# Patient Record
Sex: Female | Born: 2000 | Race: White | Hispanic: No | Marital: Single | Smoking: Never smoker
Health system: Southern US, Academic
[De-identification: ages and names within clinical notes are randomized; demographics above are authoritative.]

## PROBLEM LIST (undated history)

## (undated) ENCOUNTER — Encounter (HOSPITAL_COMMUNITY): Admission: RE | Payer: Self-pay | Source: Ambulatory Visit

## (undated) ENCOUNTER — Inpatient Hospital Stay (HOSPITAL_COMMUNITY): Admission: RE | Payer: MEDICAID | Source: Ambulatory Visit | Admitting: INTERNAL MEDICINE

## (undated) DIAGNOSIS — N182 Chronic kidney disease, stage 2 (mild): Secondary | ICD-10-CM

## (undated) DIAGNOSIS — F32A Depression, unspecified: Secondary | ICD-10-CM

## (undated) DIAGNOSIS — F329 Major depressive disorder, single episode, unspecified: Secondary | ICD-10-CM

## (undated) DIAGNOSIS — G43709 Chronic migraine without aura, not intractable, without status migrainosus: Secondary | ICD-10-CM

## (undated) DIAGNOSIS — J020 Streptococcal pharyngitis: Secondary | ICD-10-CM

## (undated) DIAGNOSIS — F319 Bipolar disorder, unspecified: Secondary | ICD-10-CM

## (undated) DIAGNOSIS — J45909 Unspecified asthma, uncomplicated: Secondary | ICD-10-CM

## (undated) DIAGNOSIS — F419 Anxiety disorder, unspecified: Secondary | ICD-10-CM

## (undated) DIAGNOSIS — F909 Attention-deficit hyperactivity disorder, unspecified type: Secondary | ICD-10-CM

## (undated) DIAGNOSIS — IMO0002 Reserved for concepts with insufficient information to code with codable children: Secondary | ICD-10-CM

## (undated) DIAGNOSIS — N390 Urinary tract infection, site not specified: Secondary | ICD-10-CM

## (undated) DIAGNOSIS — K5900 Constipation, unspecified: Secondary | ICD-10-CM

## (undated) DIAGNOSIS — N200 Calculus of kidney: Secondary | ICD-10-CM

## (undated) HISTORY — DX: Reserved for concepts with insufficient information to code with codable children: IMO0002

## (undated) HISTORY — DX: Anxiety disorder, unspecified: F41.9

## (undated) HISTORY — DX: Depression, unspecified: F32.A

## (undated) HISTORY — DX: Bipolar disorder, unspecified: F31.9

## (undated) SURGERY — GASTROSCOPY
Anesthesia: Monitor Anesthesia Care

---
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
## 2019-01-08 ENCOUNTER — Telehealth: Payer: Self-pay

## 2019-01-08 NOTE — Telephone Encounter (Signed)
Katelyn Gain, MD  [facility]        She will be set to do next nucala home dose in a month (01/30/19).  Can someone make note to call her in like 3 weeks to see if she received shipment of her home dose?       please call pt around nov 13th to see how she is doing.

## 2019-02-08 DIAGNOSIS — Z9189 Other specified personal risk factors, not elsewhere classified: Secondary | ICD-10-CM | POA: Insufficient documentation

## 2019-02-08 DIAGNOSIS — J069 Acute upper respiratory infection, unspecified: Secondary | ICD-10-CM | POA: Insufficient documentation

## 2019-02-10 DIAGNOSIS — R059 Cough, unspecified: Secondary | ICD-10-CM | POA: Insufficient documentation

## 2019-02-10 DIAGNOSIS — Z209 Contact with and (suspected) exposure to unspecified communicable disease: Secondary | ICD-10-CM | POA: Insufficient documentation

## 2019-02-24 ENCOUNTER — Ambulatory Visit: Payer: Medicaid Other | Admitting: Allergy

## 2019-03-26 ENCOUNTER — Encounter: Payer: Self-pay | Admitting: Neurology

## 2019-03-27 ENCOUNTER — Other Ambulatory Visit: Payer: Self-pay

## 2019-03-27 ENCOUNTER — Telehealth (INDEPENDENT_AMBULATORY_CARE_PROVIDER_SITE_OTHER): Payer: Medicaid Other | Admitting: Neurology

## 2019-03-27 VITALS — Ht 67.0 in | Wt 135.0 lb

## 2019-03-27 DIAGNOSIS — F984 Stereotyped movement disorders: Secondary | ICD-10-CM

## 2019-03-27 DIAGNOSIS — G43009 Migraine without aura, not intractable, without status migrainosus: Secondary | ICD-10-CM

## 2019-03-27 NOTE — Progress Notes (Signed)
   Virtual Visit via Video Note The purpose of this virtual visit is to provide medical care while limiting exposure to the novel coronavirus.    Consent was obtained for video visit:  Yes.   Answered questions that patient had about telehealth interaction:  Yes.   I discussed the limitations, risks, security and privacy concerns of performing an evaluation and management service by telemedicine. I also discussed with the patient that there may be a patient responsible charge related to this service. The patient expressed understanding and agreed to proceed.  Pt location: Home Physician Location: office Name of referring provider:  Suann Larry, PA-C I connected with [name] at patients initiation/request on 03/27/2019 at  8:30 AM EST by video enabled telemedicine application and verified that I am speaking with the correct person using two identifiers. Pt MRN:  [id number] Pt DOB:  [birth year] Video Participants:  [name]   History of Present Illness: This is a 19 y.o. female returning for follow-up of migraine headaches and new complaints of abnormal movements.  She is doing well on nortripytline 20mg  and gets headaches about 2-3 times per month, which is responsive to tylenol and imitrex. She reports seeing her psychiatrist, in [place], who asked her to see me for concern whether she has tic disorder because of episodic neck movement and repetitive hand movements.  Patient reports having these movements since childhood and it worse with stress or anxiety.  No abnormal speech pattern, forced eye closure or facial twitches.     Observations/Objective:   Vitals:   03/26/19 1100  Weight: 135 lb (61.2 kg)  Height: 5\' 7"  (1.702 m)   Patient is awake, alert, and appears comfortable.  Oriented x 4.   Extraocular muscles are intact. No ptosis.  Face is symmetric.  Speech is not dysarthric. Tongue is midline. No abnormal tongue, facial or neck movements are observed.  Antigravity in all extremities.  No pronator drift.  Finger tapping is normal.  No abnormal movements are seen in the limbs Gait appears normal.  She is able to stand on heels and toes.   Assessment and Plan:  1.  Chronic migraine, headaches are stable and occur 2-3 times per month  - Continue nortriptyline to 30mg  at bedtime  - Continue home neck PT exercises  2.  Episodic migraine without aura  - Continue Imitrex 100mg  at headache onset which is effective  3.  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  Recommend that she follow-up with counselor for stress/anxiety management coping strategies as this is does not warrant medication  4.  Polypharmacy.    Follow Up Instructions:   I discussed the assessment and treatment plan with the patient. The patient was provided an opportunity to ask questions and all were answered. The patient agreed with the plan and demonstrated an understanding of the instructions.   The patient was advised to call back or seek an in-person evaluation if the symptoms worsen or if the condition fails to improve as anticipated.  Follow-up in 8 months  Total time spent on day of this encounter:  30 min  Donika 05/24/19, DO

## 2019-05-05 ENCOUNTER — Encounter: Payer: Self-pay | Admitting: Allergy

## 2019-05-05 ENCOUNTER — Other Ambulatory Visit: Payer: Self-pay

## 2019-05-05 ENCOUNTER — Ambulatory Visit (INDEPENDENT_AMBULATORY_CARE_PROVIDER_SITE_OTHER): Payer: Medicaid Other | Admitting: Allergy

## 2019-05-05 VITALS — BP 94/62 | HR 92 | Resp 18

## 2019-05-05 DIAGNOSIS — J3089 Other allergic rhinitis: Secondary | ICD-10-CM | POA: Diagnosis not present

## 2019-05-05 DIAGNOSIS — J4550 Severe persistent asthma, uncomplicated: Secondary | ICD-10-CM | POA: Diagnosis not present

## 2019-05-05 MED ORDER — MONTELUKAST SODIUM 10 MG PO TABS: ORAL_TABLET | ORAL | 5 refills | Status: DC

## 2019-05-05 NOTE — Progress Notes (Signed)
Follow-up Note  RE: Katelyn Best MRN: [id number] DOB: 2001/02/08 Date of Office Visit: 05/05/2019   History of present illness: Katelyn Best is a 19 y.o. female presenting today for follow-up of severe persistent asthma and allergic rhinitis.  She was last seen in the office on 12/30/2018 by myself.  She states she has been having more asthma symptoms including a cough, shortness of breath.  She states she has been needing to use her albuterol more consistently during the week and needing to use her albuterol nebulizer about 2 times a week since December.  She is doing her Symbicort 2 puffs twice a day as well as Singulair daily.  She does do her Nucala at home once a month.  She did have very good control of her symptoms with nucala previously but now she is having more symptoms.  She has not required any ED or urgent care visits or any systemic steroid needs. She states with her allergic rhinitis that she is doing relatively well without any significant symptoms right now.  She does have access to Xyzal for daily use as well as Astelin and Pataday for as needed use for nasal drainage and for itchy watery eyes respectively.  Review of systems: Review of Systems  Constitutional: Negative.   HENT: Negative.   Eyes: Negative.   Respiratory: Positive for cough, shortness of breath and wheezing.   Cardiovascular: Negative.   Gastrointestinal: Negative.   Musculoskeletal: Negative.   Skin: Negative.   Neurological: Negative.     All other systems negative unless noted above in HPI  Past medical/social/surgical/family history have been reviewed and are unchanged unless specifically indicated below.  No changes  Medication List: Current Outpatient Medications  Medication Sig Dispense Refill  . acetaminophen (TYLENOL) 500 MG tablet Take 500-1,000 mg by mouth every 6 (six) hours as needed for headache (pain/ menstrual cramps).    Marland Kitchen albuterol (PROVENTIL) (2.5 MG/3ML) 0.083% nebulizer  solution Take 3 mLs (2.5 mg total) by nebulization every 4 (four) hours as needed for wheezing or shortness of breath. 75 mL 1  . EPINEPHrine 0.3 mg/0.3 mL IJ SOAJ injection Use as directed for life threatening allergic reactions 2 Device 3  . escitalopram (LEXAPRO) 20 MG tablet Take 20 mg by mouth at bedtime.    Marland Kitchen LATUDA 60 MG TABS Take 60 mg by mouth daily.     Marland Kitchen levocetirizine (XYZAL) 5 MG tablet Take one tablet by mouth once a day for runny nose or itching. (Patient taking differently: 2.5 mg. Take one tablet by mouth once a day for runny nose or itching.) 30 tablet 5  . methylphenidate 36 MG PO CR tablet Take 36 mg by mouth daily.    . montelukast (SINGULAIR) 10 MG tablet Take one tablet once daily 30 tablet 5  . Multiple Vitamin (MULTIVITAMIN) capsule Take by mouth.    . nortriptyline (PAMELOR) 10 MG capsule Take 2 capsules (20 mg total) by mouth daily. 180 capsule 3  . Nutritional Supplements (NUTRITIONAL SUPPLEMENT PO) Take by mouth. Nature's Bounty Anxiety and Stress Relief    . pantoprazole (PROTONIX) 40 MG tablet Take 40 mg by mouth daily.    Marland Kitchen PROAIR HFA 108 (90 Base) MCG/ACT inhaler INHALE TWO PUFFS INTO THE LUNGS EVERY 4 HOURS AS NEEDED FOR WHEEZING/SOB 18 g 0  . SUMAtriptan (IMITREX) 100 MG tablet Take at 1 tablet onset of severe migraine.  May repeat in 2 hours if headache persists or recurs.  Limit to 2 tablets in  24 hr period. 10 tablet 5  . SYMBICORT 80-4.5 MCG/ACT inhaler Inhale 2 puffs into the lungs 2 (two) times a day.      No current facility-administered medications for this visit.     Known medication allergies: Allergies  Allergen Reactions  . Other     Told by nephrology to avoid NSAIDs given CKD  . Advair Diskus [Fluticasone-Salmeterol] Rash  . Prednisone Rash  . Qvar [Beclomethasone] Rash     Physical examination: Blood pressure 94/62, pulse 92, resp. rate 18, SpO2 97 %.  General: Alert, interactive, in no acute distress. HEENT: PERRLA, TMs pearly gray,  turbinates non-edematous without discharge, post-pharynx non erythematous. Neck: Supple without lymphadenopathy. Lungs: Clear to auscultation without wheezing, rhonchi or rales. {no increased work of breathing. CV: Normal S1, S2 without murmurs. Abdomen: Nondistended, nontender. Skin: Warm and dry, without lesions or rashes. Extremities:  No clubbing, cyanosis or edema. Neuro:   Grossly intact.  Diagnositics/Labs:  Spirometry: FEV1: 3.61L 102%, FVC: 3.79L 94%, ratio consistent with Nonobstructive pattern  Assessment and plan:    Asthma, severe persistent   - increase in symptoms with increased albuterol needs   - will have you try Breztri 2 puffs twice a day.   This is a triple agent inhaler similar to Symbicort but with an anticholingeric medication added.  Anticholingeric agents help with decrease mucus production in the airway.    Hold your Symbicort while using Breztri sample.  Let us know if you notice improved control and less albuterol use while on Breztri.    When [place] sample runs out resume Symbicort use.    - at this time will change Nucala to Dupixent injections which are done every 2 weeks which can be done at home self-administered.  Will notify Tammy of this change to start approval process.  Change to Dupixent is in hopes of improved symptom control.      - Continue Singulair 10 mg daily-take at bedtime   - Use albuterol inhaler 2-4 puffs or nebulizer 1 vial every 4-6 hours as needed for cough, wheeze, chest tightness or shortness of breath.  May use 15-20 minutes prior to activity.  Monitor frequency of use.  Asthma control goals:   Full participation in all desired activities (may need albuterol before activity)  Albuterol use two time or less a week on average (not counting use with activity)  Cough interfering with sleep two time or less a month  Oral steroids no more than once a year  No hospitalizations  Allergic rhinitis    - continue dust mite avoidance     - Continue Xyzal 5mg  daily    - Continue nasal antihistamine, Astelin 2 sprays 1-2 times a day as needed for runny nose/drainage    - for itchy/watery/red eyes use Pataday 1 drop each eye as needed daily  Follow-up 3-4 months or sooner if needed  I appreciate the opportunity to take part in [place] care. Please do not hesitate to contact me with questions.  Sincerely,   [place], MD Allergy/Immunology [facility]

## 2019-05-05 NOTE — Patient Instructions (Addendum)
Asthma, severe persistent   - increase in symptoms with increased albuterol needs   - will have you try Breztri 2 puffs twice a day.   This is a triple agent inhaler similar to Symbicort but with an anticholingeric medication added.  Anticholingeric agents help with decrease mucus production in the airway.    Hold your Symbicort while using Breztri sample.  Let us know if you notice improved control and less albuterol use while on Breztri.    When [place] sample runs out resume Symbicort use.    - at this time will change Nucala to Dupixent injections which are done every 2 weeks which can be done at home self-administered.  Will notify Tammy of this change to start approval process.  Change to Dupixent is in hopes of improved symptom control.      - Continue Singulair 10 mg daily-take at bedtime   - Use albuterol inhaler 2-4 puffs or nebulizer 1 vial every 4-6 hours as needed for cough, wheeze, chest tightness or shortness of breath.  May use 15-20 minutes prior to activity.  Monitor frequency of use.  Asthma control goals:   Full participation in all desired activities (may need albuterol before activity)  Albuterol use two time or less a week on average (not counting use with activity)  Cough interfering with sleep two time or less a month  Oral steroids no more than once a year  No hospitalizations  Allergic rhinitis    - continue dust mite avoidance    - Continue Xyzal 5mg  daily    - Continue nasal antihistamine, Astelin 2 sprays 1-2 times a day as needed for runny nose/drainage    - for itchy/watery/red eyes use Pataday 1 drop each eye as needed daily  Follow-up 3-4 months or sooner if needed

## 2019-05-06 ENCOUNTER — Telehealth: Payer: Self-pay | Admitting: *Deleted

## 2019-05-06 NOTE — Telephone Encounter (Signed)
-----   Message from [facility] Larose Hires, MD sent at 05/05/2019  6:04 PM EST ----- Hi she seems to be having increase in asthma symptoms despite monthly Nucala.  Wanted to look into changing to Dupixent for her.

## 2019-05-06 NOTE — Telephone Encounter (Signed)
Obtained approval for Dupixent. L/M for patient to call me so I can advise submit for same to [facility]

## 2019-05-11 NOTE — Telephone Encounter (Signed)
L/m for patient again to contact me ?

## 2019-05-18 ENCOUNTER — Other Ambulatory Visit: Payer: Self-pay | Admitting: Allergy

## 2019-05-18 NOTE — Telephone Encounter (Signed)
Spoke to patient and advised submit for Dupixent and needs to answer calls from [employer] to get shipment. Instux to bring in once Rx arrives for start

## 2019-05-19 ENCOUNTER — Ambulatory Visit: Payer: Medicaid Other | Admitting: *Deleted

## 2019-05-19 ENCOUNTER — Other Ambulatory Visit: Payer: Self-pay

## 2019-05-19 DIAGNOSIS — J4550 Severe persistent asthma, uncomplicated: Secondary | ICD-10-CM

## 2019-05-19 NOTE — Telephone Encounter (Signed)
Please advise 

## 2019-05-19 NOTE — Progress Notes (Signed)
Katelyn Best started her Dupixent today. She received 600mg  for her loading dose and will continue to receive 300mg  every 2 weeks, self-administering at home. I demonstrated using the demo device and Shazia then administered the Dupixent injections herself. She had no questions and she has an Epipen. Consent signed.

## 2019-06-05 DIAGNOSIS — J029 Acute pharyngitis, unspecified: Secondary | ICD-10-CM | POA: Insufficient documentation

## 2019-06-05 DIAGNOSIS — H6980 Other specified disorders of Eustachian tube, unspecified ear: Secondary | ICD-10-CM | POA: Insufficient documentation

## 2019-06-08 DIAGNOSIS — H66009 Acute suppurative otitis media without spontaneous rupture of ear drum, unspecified ear: Secondary | ICD-10-CM | POA: Insufficient documentation

## 2019-06-26 ENCOUNTER — Ambulatory Visit: Payer: Medicaid Other | Admitting: Neurology

## 2019-08-04 ENCOUNTER — Ambulatory Visit (INDEPENDENT_AMBULATORY_CARE_PROVIDER_SITE_OTHER): Payer: Medicaid Other | Admitting: Allergy

## 2019-08-04 ENCOUNTER — Encounter: Payer: Self-pay | Admitting: Allergy

## 2019-08-04 ENCOUNTER — Other Ambulatory Visit: Payer: Self-pay

## 2019-08-04 VITALS — BP 108/62 | HR 97 | Resp 18

## 2019-08-04 DIAGNOSIS — W57XXXD Bitten or stung by nonvenomous insect and other nonvenomous arthropods, subsequent encounter: Secondary | ICD-10-CM | POA: Diagnosis not present

## 2019-08-04 DIAGNOSIS — S50861D Insect bite (nonvenomous) of right forearm, subsequent encounter: Secondary | ICD-10-CM

## 2019-08-04 DIAGNOSIS — J3089 Other allergic rhinitis: Secondary | ICD-10-CM | POA: Diagnosis not present

## 2019-08-04 DIAGNOSIS — J4550 Severe persistent asthma, uncomplicated: Secondary | ICD-10-CM | POA: Diagnosis not present

## 2019-08-04 MED ORDER — TRIAMCINOLONE ACETONIDE 0.1 % EX OINT: TOPICAL_OINTMENT | CUTANEOUS | 3 refills | Status: DC

## 2019-08-04 MED ORDER — SYMBICORT 160-4.5 MCG/ACT IN AERO: INHALATION_SPRAY | RESPIRATORY_TRACT | 5 refills | Status: DC

## 2019-08-04 NOTE — Patient Instructions (Signed)
Asthma, severe persistent   - doing much better on Dupixent injections every 2 weeks!    - have access to Symbicort 2 puffs twice a day to initiate if not meeting below goals.   also can use Symbicort 2 puffs as needed for relief of symptoms (similar to how you use albuterol)  - continue Dupixent injections every 2 weeks self-administered.    - Continue Singulair 10 mg daily-take at bedtime   - Use albuterol inhaler 2-4 puffs or nebulizer 1 vial every 4-6 hours as needed for cough, wheeze, chest tightness or shortness of breath.  May use 15-20 minutes prior to activity.  Monitor frequency of use.  Asthma control goals:   Full participation in all desired activities (may need albuterol before activity)  Albuterol use two time or less a week on average (not counting use with activity)  Cough interfering with sleep two time or less a month  Oral steroids no more than once a year  No hospitalizations  Allergic rhinitis    - continue dust mite avoidance    - Continue Xyzal 5mg  daily (may take additional dose if needed)   - Continue nasal antihistamine, Astelin 2 sprays 1-2 times a day as needed for runny nose/drainage    - for itchy/watery/red eyes use Pataday 1 drop each eye as needed daily  Insect bites  - several insect bites with one likely with secondary skin infection.  Complete keflex course  - on smaller, red lesions can use Triamcinolone ointment twice a day as needed   Follow-up 4 months or sooner if needed
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
## 2019-10-26 ENCOUNTER — Ambulatory Visit: Payer: Medicaid Other | Admitting: Neurology

## 2019-11-02 ENCOUNTER — Other Ambulatory Visit: Payer: Self-pay | Admitting: Allergy and Immunology

## 2019-12-08 ENCOUNTER — Encounter: Payer: Self-pay | Admitting: Allergy

## 2019-12-08 ENCOUNTER — Other Ambulatory Visit: Payer: Self-pay

## 2019-12-08 ENCOUNTER — Ambulatory Visit (INDEPENDENT_AMBULATORY_CARE_PROVIDER_SITE_OTHER): Payer: Medicaid Other | Admitting: Allergy

## 2019-12-08 VITALS — BP 108/68 | HR 110 | Resp 16

## 2019-12-08 DIAGNOSIS — J4550 Severe persistent asthma, uncomplicated: Secondary | ICD-10-CM

## 2019-12-08 DIAGNOSIS — J3089 Other allergic rhinitis: Secondary | ICD-10-CM

## 2019-12-08 MED ORDER — MONTELUKAST SODIUM 10 MG PO TABS: ORAL_TABLET | ORAL | 5 refills | Status: DC

## 2019-12-08 NOTE — Patient Instructions (Addendum)
Asthma, severe persistent   - under good control on Dupixent injections every 2 weeks!    - have access to Symbicort 2 puffs twice a day to initiate if not meeting below goals.   also can use Symbicort 2 puffs as needed for relief of symptoms (similar to how you use albuterol)  - continue Dupixent injections every 2 weeks self-administered.      - Continue Singulair 10 mg daily-take at bedtime   - Use albuterol inhaler 2-4 puffs or nebulizer 1 vial every 4-6 hours as needed for cough, wheeze, chest tightness or shortness of breath.  May use 15-20 minutes prior to activity.  Monitor frequency of use.  Asthma control goals:   Full participation in all desired activities (may need albuterol before activity)  Albuterol use two time or less a week on average (not counting use with activity)  Cough interfering with sleep two time or less a month  Oral steroids no more than once a year  No hospitalizations  Allergic rhinitis    - continue dust mite avoidance    - Continue Xyzal 5mg  daily (may take additional dose if needed)   - Continue nasal antihistamine, Astelin 2 sprays 1-2 times a day as needed for runny nose/drainage    - for itchy/watery/red eyes use Pataday 1 drop each eye as needed daily  Follow-up 6 months or sooner if needed

## 2019-12-08 NOTE — Progress Notes (Signed)
Follow-up Note  RE: Katelyn Best MRN: [id number] DOB: 2000-09-11 Date of Office Visit: 12/08/2019   History of present illness: Katelyn Best is a 19 y.o. female presenting today for follow-up of asthma, allergic rhinitis.   She was last seen in the office on 08/04/19 by myself.  Since her last visit she had a bunionectomy performed.  She states this was her second surgery as she had to have a revision done.  She is currently in a cast and using crutches.  She states she has another week of the cast and then will go into a boot. She otherwise states she has been doing well.  She continues her Dupixent injections every 2 weeks at a self-administered without any issues.  She continues to take singular daily.  She states she is using her Symbicort as needed at this time and has not needed to use it since her last visit.  She also has not needed to use her albuterol either.  She denies any urgent care or ED visits or any systemic steroid needs.  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 She also states with her allergic rhinitis that she has not had any symptoms either this summer that warranted use of Xyzal, Astelin or Pataday.  She states she has been using these all as needed and has not needed them.  Review of systems: Review of Systems  Constitutional: Negative.   HENT: Negative.   Eyes: Negative.   Respiratory: Negative.   Cardiovascular: Negative.   Gastrointestinal: Negative.   Musculoskeletal: Negative.   Skin: Negative.   Neurological: Negative.     All other systems negative unless noted above in HPI  Past medical/social/surgical/family history have been reviewed and are unchanged unless specifically indicated below.  No changes  Medication List: Current Outpatient Medications  Medication Sig Dispense Refill  . acetaminophen (TYLENOL) 500 MG  tablet Take 500-1,000 mg by mouth every 6 (six) hours as needed for headache (pain/ menstrual cramps).    Marland Kitchen albuterol (PROVENTIL) (2.5 MG/3ML) 0.083% nebulizer solution Take 3 mLs (2.5 mg total) by nebulization every 4 (four) hours as needed for wheezing or shortness of breath. 75 mL 1  . CONCERTA 27 MG CR tablet Take 27 mg by mouth every morning.    . DUPIXENT 300 MG/2ML SOPN INJECT 2 PENS UNDER THE SKIN ON DAY 1, THEN INJECT 1 PEN ON DAY 15, THEN 1 PEN EVERY OTHER WEEK THEREAFTER 8 mL 11  . EPINEPHrine 0.3 mg/0.3 mL IJ SOAJ injection Use as directed for life threatening allergic reactions 2 Device 3  . escitalopram (LEXAPRO) 20 MG tablet Take 20 mg by mouth at bedtime.    Marland Kitchen LATUDA 60 MG TABS Take 60 mg by mouth daily.     Marland Kitchen levocetirizine (XYZAL) 5 MG tablet TAKE ONE TABLET BY MOUTH EVERY DAY FOR RUNNY NOSE OR ITCHING 30 tablet 5  . montelukast (SINGULAIR) 10 MG tablet Take one tablet once daily 30 tablet 5  . Multiple Vitamin (MULTIVITAMIN) capsule Take by mouth.    . nortriptyline (PAMELOR) 10 MG capsule Take 2 capsules (20 mg total) by mouth daily. 180 capsule 3  . Nutritional Supplements (NUTRITIONAL SUPPLEMENT PO) Take by mouth. Nature's Bounty Anxiety and Stress Relief    . pantoprazole (PROTONIX) 40 MG tablet Take 40 mg by mouth daily.    Marland Kitchen  PROAIR HFA 108 (90 Base) MCG/ACT inhaler INHALE TWO PUFFS INTO THE LUNGS EVERY 4 HOURS AS NEEDED FOR WHEEZING/SOB 18 g 0  . SUMAtriptan (IMITREX) 100 MG tablet Take at 1 tablet onset of severe migraine.  May repeat in 2 hours if headache persists or recurs.  Limit to 2 tablets in 24 hr period. 10 tablet 5  . SYMBICORT 160-4.5 MCG/ACT inhaler Inhale two puffs twice daily to prevent cough or wheeze.  Rinse, gargle, and spit after use. 1 Inhaler 5  . triamcinolone ointment (KENALOG) 0.1 % Can apply to red lesions twice daily as directed. 30 g 3   No current facility-administered medications for this visit.     Known medication allergies: Allergies   Allergen Reactions  . Other     Told by nephrology to avoid NSAIDs given CKD  . Advair Diskus [Fluticasone-Salmeterol] Rash  . Prednisone Rash  . Qvar [Beclomethasone] Rash     Physical examination: Blood pressure 108/68, pulse (!) 110, resp. rate 16, SpO2 97 %.  General: Alert, interactive, in no acute distress. HEENT: PERRLA, TMs pearly gray, turbinates non-edematous without discharge, post-pharynx non erythematous. Neck: Supple without lymphadenopathy. Lungs: Clear to auscultation without wheezing, rhonchi or rales. {no increased work of breathing. CV: Normal S1, S2 without murmurs. Abdomen: Nondistended, nontender. Skin: Warm and dry, without lesions or rashes. Extremities:  No clubbing, cyanosis or edema. Neuro:   Grossly intact.  Diagnositics/Labs: None today  Assessment and plan:    Asthma, severe persistent   - under good control on Dupixent injections every 2 weeks!    - have access to Symbicort 2 puffs twice a day to initiate if not meeting below goals.   also can use Symbicort 2 puffs as needed for relief of symptoms (similar to how you use albuterol)  - continue Dupixent injections every 2 weeks self-administered.      - Continue Singulair 10 mg daily-take at bedtime   - Use albuterol inhaler 2-4 puffs or nebulizer 1 vial every 4-6 hours as needed for cough, wheeze, chest tightness or shortness of breath.  May use 15-20 minutes prior to activity.  Monitor frequency of use.  -will plan to check spirometry next visit  Asthma control goals:   Full participation in all desired activities (may need albuterol before activity)  Albuterol use two time or less a week on average (not counting use with activity)  Cough interfering with sleep two time or less a month  Oral steroids no more than once a year  No hospitalizations  Allergic rhinitis    - continue dust mite avoidance    - Continue Xyzal 5mg  daily (may take additional dose if needed)   - Continue  nasal antihistamine, Astelin 2 sprays 1-2 times a day as needed for runny nose/drainage    - for itchy/watery/red eyes use Pataday 1 drop each eye as needed daily  Follow-up 6 months or sooner if needed  I appreciate the opportunity to take part in [facility] care. Please do not hesitate to contact me with questions.  Sincerely,   [facility], MD Allergy/Immunology [facility]

## 2019-12-28 ENCOUNTER — Other Ambulatory Visit: Payer: Self-pay | Admitting: Neurology

## 2020-01-28 DIAGNOSIS — H1032 Unspecified acute conjunctivitis, left eye: Secondary | ICD-10-CM | POA: Insufficient documentation

## 2020-03-23 ENCOUNTER — Other Ambulatory Visit (HOSPITAL_BASED_OUTPATIENT_CLINIC_OR_DEPARTMENT_OTHER): Payer: Self-pay | Admitting: Family Medicine

## 2020-03-23 MED ORDER — FLUDROCORTISONE 0.1 MG TABLET
0.10 mg | ORAL_TABLET | Freq: Every morning | ORAL | 0 refills | Status: DC
Start: 2020-03-23 — End: 2020-04-14
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
## 2020-04-14 ENCOUNTER — Other Ambulatory Visit (HOSPITAL_BASED_OUTPATIENT_CLINIC_OR_DEPARTMENT_OTHER): Payer: Self-pay | Admitting: Family Medicine

## 2020-04-14 ENCOUNTER — Encounter (HOSPITAL_BASED_OUTPATIENT_CLINIC_OR_DEPARTMENT_OTHER): Payer: Self-pay | Admitting: Family Medicine

## 2020-04-14 ENCOUNTER — Other Ambulatory Visit: Payer: Self-pay

## 2020-04-14 ENCOUNTER — Ambulatory Visit: Payer: MEDICAID | Attending: Family Medicine | Admitting: Family Medicine

## 2020-04-14 VITALS — BP 108/73 | HR 100 | Temp 97.8°F | Ht 61.0 in | Wt 119.6 lb

## 2020-04-14 DIAGNOSIS — Z0000 Encounter for general adult medical examination without abnormal findings: Secondary | ICD-10-CM

## 2020-04-14 DIAGNOSIS — Z6822 Body mass index (BMI) 22.0-22.9, adult: Secondary | ICD-10-CM

## 2020-04-14 DIAGNOSIS — G43909 Migraine, unspecified, not intractable, without status migrainosus: Secondary | ICD-10-CM

## 2020-04-14 DIAGNOSIS — F411 Generalized anxiety disorder: Secondary | ICD-10-CM

## 2020-04-14 DIAGNOSIS — J45909 Unspecified asthma, uncomplicated: Secondary | ICD-10-CM | POA: Insufficient documentation

## 2020-04-14 DIAGNOSIS — I10 Essential (primary) hypertension: Secondary | ICD-10-CM | POA: Insufficient documentation

## 2020-04-14 MED ORDER — FLUTICASONE PROPIONATE 50 MCG/ACTUATION NASAL SPRAY,SUSPENSION
NASAL | 5 refills | Status: DC
Start: 2020-04-14 — End: 2020-10-17

## 2020-04-14 MED ORDER — ALBUTEROL SULFATE HFA 90 MCG/ACTUATION AEROSOL INHALER
INHALATION_SPRAY | RESPIRATORY_TRACT | 5 refills | Status: DC
Start: 2020-04-14 — End: 2020-04-14

## 2020-04-14 MED ORDER — FLUDROCORTISONE 0.1 MG TABLET
0.10 mg | ORAL_TABLET | Freq: Every morning | ORAL | 5 refills | Status: DC
Start: 2020-04-14 — End: 2020-04-14

## 2020-04-14 MED ORDER — FLUDROCORTISONE 0.1 MG TABLET
0.10 mg | ORAL_TABLET | Freq: Every morning | ORAL | 5 refills | Status: AC
Start: 2020-04-14 — End: ?

## 2020-04-14 MED ORDER — ALBUTEROL SULFATE HFA 90 MCG/ACTUATION AEROSOL INHALER
1.0000 | INHALATION_SPRAY | Freq: Four times a day (QID) | RESPIRATORY_TRACT | 3 refills | Status: AC | PRN
Start: 2020-04-14 — End: ?

## 2020-04-14 NOTE — Nursing Note (Signed)
04/14/20 0900   Depression Screen   Little interest or pleasure in doing things. 0   Feeling down, depressed, or hopeless 3   PHQ 2 Total 3   Trouble falling or staying asleep, or sleeping too much. 3   Feeling tired or having little energy 1   Poor appetite or overeating 3   Feeling bad about yourself/ that you are a failure in the past 2 weeks? 3   Trouble concentrating on things in the past 2 weeks? 3   Moving/Speaking slowly or being fidgety or restless  in the past 2 weeks? 0   Thoughts that you would be better off DEAD, or of hurting yourself in some way. 0   If you checked off any problems, how difficult have these problems made it for you to do your work, take care of things at home, or get along with other people? Somewhat difficult   PHQ 9 Total 16

## 2020-04-14 NOTE — Nursing Note (Signed)
Tonna here today for new pt visit, previously saw dr Sharren Bridge group,  follows w/ dr Corey Skains, & the process- O. Marcello Moores,  she has a lot of daily anxiety w/ people, driving, social, she feels her depression sorta feeds off of her anxiety, would like some medication help.

## 2020-05-24 ENCOUNTER — Other Ambulatory Visit: Payer: Self-pay

## 2020-05-24 ENCOUNTER — Ambulatory Visit (INDEPENDENT_AMBULATORY_CARE_PROVIDER_SITE_OTHER): Payer: MEDICAID | Admitting: Family

## 2020-05-24 ENCOUNTER — Encounter (INDEPENDENT_AMBULATORY_CARE_PROVIDER_SITE_OTHER): Payer: Self-pay | Admitting: Family

## 2020-05-24 DIAGNOSIS — F39 Unspecified mood [affective] disorder: Secondary | ICD-10-CM

## 2020-05-24 MED ORDER — LAMOTRIGINE 25 MG TABLET
50.0000 mg | ORAL_TABLET | Freq: Every day | ORAL | 0 refills | Status: DC
Start: 2020-05-24 — End: 2020-06-21

## 2020-05-24 NOTE — Nursing Note (Signed)
05/24/20 0955   PHQ 9 (follow up)   Little interest or pleasure in doing things. 2   Feeling down, depressed, or hopeless 2   PHQ 2 Total 4   Trouble falling or staying asleep, or sleeping too much. 3   Feeling tired or having little energy 2   Poor appetite or overeating 1   Feeling bad about yourself/ that you are a failure in the past 2 weeks? 1   Trouble concentrating on things in the past 2 weeks? 3   Moving/Speaking slowly or being fidgety or restless  in the past 2 weeks? 2   Thoughts that you would be better off DEAD, or of hurting yourself in some way. 1   If you checked off any problems, how difficult have these problems made it for you to do your work, take care of things at home, or get along with other people? Very difficult   PHQ 9 Total 17   Interpretation of Total Score Moderate/Severe depression

## 2020-05-24 NOTE — Nursing Note (Signed)
05/24/20 [place] Severity Rating Scale (Since Last Contact Screener)   1. Wish to be Dead (Since Last Contact) N   2. Non-Specific Active Suicidal Thoughts (Since Last Contact) N   6. Suicidal Behavior (Since Last Contact) N   Calculated C-SSRS Risk Score (Since Last Contact) No Risk Indicated
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
## 2020-06-01 ENCOUNTER — Other Ambulatory Visit: Payer: Self-pay | Admitting: Allergy

## 2020-06-07 ENCOUNTER — Ambulatory Visit (INDEPENDENT_AMBULATORY_CARE_PROVIDER_SITE_OTHER): Payer: Medicaid Other | Admitting: Allergy

## 2020-06-07 ENCOUNTER — Encounter: Payer: Self-pay | Admitting: Allergy

## 2020-06-07 ENCOUNTER — Other Ambulatory Visit: Payer: Self-pay

## 2020-06-07 VITALS — BP 102/62 | HR 78 | Resp 16 | Ht 67.0 in | Wt 165.2 lb

## 2020-06-07 DIAGNOSIS — J4550 Severe persistent asthma, uncomplicated: Secondary | ICD-10-CM

## 2020-06-07 DIAGNOSIS — J3089 Other allergic rhinitis: Secondary | ICD-10-CM | POA: Diagnosis not present

## 2020-06-07 MED ORDER — LEVOCETIRIZINE DIHYDROCHLORIDE 5 MG PO TABS: ORAL_TABLET | ORAL | 5 refills | Status: DC

## 2020-06-07 NOTE — Patient Instructions (Addendum)
Asthma, severe persistent   - have access to Symbicort 2 puffs twice a day to initiate if not meeting below goals.   Can use Symbicort 2 puffs as needed for relief of symptoms (similar to how you use albuterol)  - continue Dupixent injections every 2 weeks self-administered      - Continue Singulair 10 mg daily-take at bedtime  - Use albuterol inhaler 2-4 puffs or nebulizer 1 vial every 4-6 hours as needed for cough, wheeze, chest tightness or shortness of breath.  May use 15-20 minutes prior to activity.  Monitor frequency of use.  Asthma control goals:   Full participation in all desired activities (may need albuterol before activity)  Albuterol use two time or less a week on average (not counting use with activity)  Cough interfering with sleep two time or less a month  Oral steroids no more than once a year  No hospitalizations  Allergic rhinitis    - continue dust mite avoidance    - Use long-acting antihistamine like Xyzal 5mg  daily as needed (may take additional dose if needed)   - Use nasal antihistamine, Astelin 2 sprays 1-2 times a day as needed for runny nose/drainage or itchy nose.  This is an nasal antihistamine   - for itchy/watery/red eyes use Pataday 1 drop each eye as needed daily  Follow-up 4-6 months or sooner if needed

## 2020-06-07 NOTE — Progress Notes (Signed)
Follow-up Note  RE: Katelyn Best MRN: [id number] DOB: 2000/07/01 Date of Office Visit: 06/07/2020   History of present illness: Katelyn Best is a 20 y.o. female presenting today for follow-up of asthma and allergic rhinitis.  She was last seen in the office on 12/08/19 by myself.   She states her asthma has been well controlled (outside of the Covid illness) since the last visit.  She states she has had a lot of mental and physical challenges since the last visit.   She had a family death over the summer and has been going in-between jobs and also reports had a pregnancy scare.  This has gotten her off track with dupixent.  She states she may not remember to do the Dupixent every 2weeks as she should and it may be done every 3 to 4 weeks.  She states by the 3rd-4th week she is noticing a symptoms of of cough that she may need to use her rescue inhaler for.  This reminds her to take her Dupixent at that time.  She does continue to use Symbicort more so as needed at this time.  She also does continue to take her Singulair daily.  She did use it daily while she had the Covid illness.  She did have Covid and bronchitis toward the end of January 2022.  She did go to an urgent care for this illness and tested positive there.  She states she did take prednisone as well as her Symbicort and albuterol inhaler.  States did not have to use nebulizer with this illness.  She reports she did have a lot of chest pain/tightness.  With the onset of spring and pollen season she has noted more itchy nose and some nasal drainage.  She states she moved in with her boyfriend over the holiday season and still is unpacking that she is not sure where her nasal Astelin is packed.  That she has not had a to use it for the ear drainage or itchy nose.  She has an upcoming trip to [facility] that she is very excited for.  Review of systems in the past 4 weeks: Review of Systems  Constitutional: Negative.   HENT:       See HPI   Eyes: Negative.   Respiratory:       See HPI  Cardiovascular: Negative.   Gastrointestinal: Negative.   Musculoskeletal: Negative.   Skin: Negative.   Neurological: Negative.     All other systems negative unless noted above in HPI  Past medical/social/surgical/family history have been reviewed and are unchanged unless specifically indicated below.  No changes  Medication List: Current Outpatient Medications  Medication Sig Dispense Refill  . acetaminophen (TYLENOL) 500 MG tablet Take 500-1,000 mg by mouth every 6 (six) hours as needed for headache (pain/ menstrual cramps).    Marland Kitchen albuterol (PROVENTIL) (2.5 MG/3ML) 0.083% nebulizer solution Take 3 mLs (2.5 mg total) by nebulization every 4 (four) hours as needed for wheezing or shortness of breath. 75 mL 1  . CONCERTA 27 MG CR tablet Take 27 mg by mouth every morning.    . DUPIXENT 300 MG/2ML SOPN INJECT 1 PEN UNDER THE SKIN EVERY OTHER WEEK 4 mL 11  . EPINEPHrine 0.3 mg/0.3 mL IJ SOAJ injection Use as directed for life threatening allergic reactions 2 Device 3  . escitalopram (LEXAPRO) 20 MG tablet Take 20 mg by mouth at bedtime.    Marland Kitchen LATUDA 60 MG TABS Take 60 mg by mouth daily.     Marland Kitchen  levocetirizine (XYZAL) 5 MG tablet TAKE ONE TABLET BY MOUTH EVERY DAY FOR RUNNY NOSE OR ITCHING 30 tablet 5  . montelukast (SINGULAIR) 10 MG tablet Take one tablet once daily 30 tablet 5  . Multiple Vitamin (MULTIVITAMIN) capsule Take by mouth.    . nortriptyline (PAMELOR) 10 MG capsule Take 3 capsules (30 mg total) by mouth at bedtime. Schedule appointment for additional refills. 270 capsule 0  . Nutritional Supplements (NUTRITIONAL SUPPLEMENT PO) Take by mouth. Nature's Bounty Anxiety and Stress Relief    . pantoprazole (PROTONIX) 40 MG tablet Take 40 mg by mouth daily.    Marland Kitchen PROAIR HFA 108 (90 Base) MCG/ACT inhaler INHALE TWO PUFFS INTO THE LUNGS EVERY 4 HOURS AS NEEDED FOR WHEEZING/SOB 18 g 0  . SUMAtriptan (IMITREX) 100 MG tablet Take at 1 tablet  onset of severe migraine.  May repeat in 2 hours if headache persists or recurs.  Limit to 2 tablets in 24 hr period. 10 tablet 5  . SYMBICORT 160-4.5 MCG/ACT inhaler Inhale two puffs twice daily to prevent cough or wheeze.  Rinse, gargle, and spit after use. 1 Inhaler 5  . triamcinolone ointment (KENALOG) 0.1 % Can apply to red lesions twice daily as directed. 30 g 3  . venlafaxine XR (EFFEXOR-XR) 75 MG 24 hr capsule Take 75 mg by mouth daily.     No current facility-administered medications for this visit.     Known medication allergies: Allergies  Allergen Reactions  . Other     Told by nephrology to avoid NSAIDs given CKD  . Advair Diskus [Fluticasone-Salmeterol] Rash  . Prednisone Rash  . Qvar [Beclomethasone] Rash     Physical examination: Blood pressure 102/62, pulse 78, resp. rate 16, height 5\' 7"  (1.702 m), weight 165 lb 3.2 oz (74.9 kg), SpO2 93 %.  General: Alert, interactive, in no acute distress. HEENT: PERRLA, TMs pearly gray, turbinates non-edematous without discharge, post-pharynx non erythematous. Neck: Supple without lymphadenopathy. Lungs: Clear to auscultation without wheezing, rhonchi or rales. {no increased work of breathing. CV: Normal S1, S2 without murmurs. Abdomen: Nondistended, nontender. Skin: Warm and dry, without lesions or rashes. Extremities:  No clubbing, cyanosis or edema. Neuro:   Grossly intact.  Diagnositics/Labs:  Spirometry: FEV1: 3.55L 99%, FVC: 4.17L 100%, ratio consistent with Nonobstructive pattern  Assessment and plan:    Asthma, severe persistent   - have access to Symbicort 2 puffs twice a day to initiate if not meeting below goals.   Can use Symbicort 2 puffs as needed for relief of symptoms (similar to how you use albuterol)  - continue Dupixent injections every 2 weeks self-administered      - Continue Singulair 10 mg daily-take at bedtime  - Use albuterol inhaler 2-4 puffs or nebulizer 1 vial every 4-6 hours as needed  for cough, wheeze, chest tightness or shortness of breath.  May use 15-20 minutes prior to activity.  Monitor frequency of use.  - Discussed the importance of staying consistent with Dupixent use every 2 weeks to maintain good control.  - also advised to let know if she does become pregnant so that we can make sure her control continues to be good  Asthma control goals:   Full participation in all desired activities (may need albuterol before activity)  Albuterol use two time or less a week on average (not counting use with activity)  Cough interfering with sleep two time or less a month  Oral steroids no more than once a year  No  hospitalizations  Allergic rhinitis    - continue dust mite avoidance    - Use long-acting antihistamine like Xyzal 5mg  daily as needed (may take additional dose if needed)   - Use nasal antihistamine, Astelin 2 sprays 1-2 times a day as needed for runny nose/drainage or itchy nose.  This is an nasal antihistamine   - for itchy/watery/red eyes use Pataday 1 drop each eye as needed daily  Follow-up 4-6 months or sooner if needed And I guess the 1 mg but only get simpler to measure out I appreciate the opportunity to take part in [place] care. Please do not hesitate to contact me with questions.  Sincerely,   [place], MD Allergy/Immunology [facility]

## 2020-06-16 ENCOUNTER — Other Ambulatory Visit (HOSPITAL_BASED_OUTPATIENT_CLINIC_OR_DEPARTMENT_OTHER): Payer: Self-pay | Admitting: Family Medicine

## 2020-06-16 ENCOUNTER — Telehealth (INDEPENDENT_AMBULATORY_CARE_PROVIDER_SITE_OTHER): Payer: Self-pay | Admitting: Family

## 2020-06-16 MED ORDER — LORATADINE 5 MG-PSEUDOEPHEDRINE ER 120 MG TABLET,EXTENDED RELEASE,12HR
1.0000 | ORAL_TABLET | Freq: Two times a day (BID) | ORAL | 0 refills | Status: AC
Start: 2020-06-16 — End: ?

## 2020-06-16 NOTE — Telephone Encounter (Signed)
Pt called wanting to reschedule appt because she never started taking Lamictal as you ordered. States that her therapist believes that she needs to start off with "something basic for depression and something for anxiety, not a mood stabilizer." She did reschedule and is asking about a different medication.

## 2020-06-16 NOTE — Telephone Encounter (Signed)
Based on my assessment, she is showing signs of Bipolar disorder... the treatment is a mood stabilizer.

## 2020-06-16 NOTE — Telephone Encounter (Signed)
Left message informing pt. 

## 2020-06-21 ENCOUNTER — Encounter (INDEPENDENT_AMBULATORY_CARE_PROVIDER_SITE_OTHER): Payer: Self-pay | Admitting: Family

## 2020-06-21 ENCOUNTER — Other Ambulatory Visit (INDEPENDENT_AMBULATORY_CARE_PROVIDER_SITE_OTHER): Payer: Self-pay | Admitting: Family

## 2020-06-23 ENCOUNTER — Other Ambulatory Visit: Payer: Self-pay

## 2020-06-23 ENCOUNTER — Emergency Department (HOSPITAL_COMMUNITY)
Admission: EM | Admit: 2020-06-23 | Discharge: 2020-06-23 | Disposition: A | Discharge status: Home / Self Care | Payer: Medicaid Other | Attending: Emergency Medicine | Admitting: Emergency Medicine

## 2020-06-23 ENCOUNTER — Emergency Department (HOSPITAL_COMMUNITY): Payer: Medicaid Other

## 2020-06-23 DIAGNOSIS — Z7951 Long term (current) use of inhaled steroids: Secondary | ICD-10-CM | POA: Diagnosis not present

## 2020-06-23 DIAGNOSIS — N182 Chronic kidney disease, stage 2 (mild): Secondary | ICD-10-CM | POA: Insufficient documentation

## 2020-06-23 DIAGNOSIS — Z20822 Contact with and (suspected) exposure to covid-19: Secondary | ICD-10-CM | POA: Diagnosis not present

## 2020-06-23 DIAGNOSIS — J4521 Mild intermittent asthma with (acute) exacerbation: Secondary | ICD-10-CM

## 2020-06-23 DIAGNOSIS — R059 Cough, unspecified: Secondary | ICD-10-CM | POA: Diagnosis present

## 2020-06-23 LAB — SARS CORONAVIRUS 2 (TAT 6-24 HRS): SARS Coronavirus 2: NEGATIVE

## 2020-06-23 MED ORDER — METHYLPREDNISOLONE 4 MG PO TBPK: ORAL_TABLET | ORAL | 0 refills | Status: DC

## 2020-06-23 NOTE — ED Provider Notes (Signed)
[facility] EMERGENCY DEPARTMENT Provider Note   CSN: [id number] Arrival date & time: 06/23/20  1554     History Chief Complaint  Patient presents with  . Pneumonia    Katelyn Best is a 20 y.o. female.  HPI 20 year old female with a history of ADHD, anxiety, asthma, bipolar disorder, CKD stage II, depression presents to the ER with complaints of cough.  She was seen at urgent care yesterday and was told that she has a right-sided pneumonia on chest x-ray.  She has been taking Augmentin for dog bite and was not given any additional antibiotics.  She states that they told her that if her cough continues to get worse, she needs to come to the ER.  She states that overnight, she did have an increasing dry, nonproductive cough.  Denies any hemoptysis.  Denies any leg swelling.  States she had a fever of 100.7 overnight.  She was not swabbed for Covid at urgent care as she was told it is too early.  She is vaccinated but not boosted.  Patient has frequent visits to the ER for asthma exacerbation.  She has been compliant with her asthma medicines.  She also complains of some chest pain on the left side which she feels like is reproducible.  She thinks this may be secondary due to coughing.  She denies any pleuritic symptoms.  Not on any oral birth control.  No recent travel or prior history of PE.  States that she feels like her symptoms are fairly similar to her prior asthma exacerbations.    Past Medical History:  Diagnosis Date  . ADHD (attention deficit hyperactivity disorder)   . Anxiety   . Asthma   . Bipolar disorder (HCC)   . Chronic migraine   . CKD (chronic kidney disease) stage 2, GFR 60-89 ml/min   . Depression   . Strep pharyngitis     Patient Active Problem List   Diagnosis Date Noted  . Chronic migraine 03/14/2018  . Migraine without aura and without status migrainosus, not intractable 03/14/2018  . Adjustment disorder with anxious mood     Past  Surgical History:  Procedure Laterality Date  . WISDOM TOOTH EXTRACTION  09/09/2017     OB History   No obstetric history on file.     Family History  Problem Relation Age of Onset  . Cancer Mother   . Diabetes Maternal Grandfather   . Hyperlipidemia Maternal Grandfather   . Asthma Father   . Diabetes Maternal Grandmother     Social History   Tobacco Use  . Smoking status: Never Smoker  . Smokeless tobacco: Never Used  Vaping Use  . Vaping Use: Never used  Substance Use Topics  . Alcohol use: No  . Drug use: No    Home Medications Prior to Admission medications   Medication Sig Start Date End Date Taking? Authorizing Provider  acetaminophen (TYLENOL) 500 MG tablet Take 500-1,000 mg by mouth every 6 (six) hours as needed for headache (pain/ menstrual cramps).    [provider]  albuterol (PROVENTIL) (2.5 MG/3ML) 0.083% nebulizer solution Take 3 mLs (2.5 mg total) by nebulization every 4 (four) hours as needed for wheezing or shortness of breath. 07/23/17   Marcelyn Bruins, MD  CONCERTA 27 MG CR tablet Take 27 mg by mouth every morning. 07/09/19   [provider]  DUPIXENT 300 MG/2ML SOPN INJECT 1 PEN UNDER THE SKIN EVERY OTHER WEEK 06/01/20   Marcelyn Bruins, MD  EPINEPHrine 0.3 mg/0.3 mL IJ SOAJ injection Use as directed for life threatening allergic reactions 02/26/17   Kozlow, Alvira Philips, MD  escitalopram (LEXAPRO) 20 MG tablet Take 20 mg by mouth at bedtime.    [provider]  LATUDA 60 MG TABS Take 60 mg by mouth daily.  12/21/18   [provider]  levocetirizine (XYZAL) 5 MG tablet TAKE ONE TABLET BY MOUTH EVERY DAY FOR RUNNY NOSE OR ITCHING 06/07/20   Marcelyn Bruins, MD  methylPREDNISolone (MEDROL DOSEPAK) 4 MG TBPK tablet Take as directed until finished 06/23/20   Mare Ferrari, PA-C  montelukast (SINGULAIR) 10 MG tablet Take one tablet once daily 12/08/19   Marcelyn Bruins, MD  Multiple Vitamin  (MULTIVITAMIN) capsule Take by mouth.    [provider]  nortriptyline (PAMELOR) 10 MG capsule Take 3 capsules (30 mg total) by mouth at bedtime. Schedule appointment for additional refills. 12/29/19   Nita Sickle K, DO  Nutritional Supplements (NUTRITIONAL SUPPLEMENT PO) Take by mouth. Nature's Bounty Anxiety and Stress Relief    [provider]  pantoprazole (PROTONIX) 40 MG tablet Take 40 mg by mouth daily. 10/05/18   [provider]  PROAIR HFA 108 (90 Base) MCG/ACT inhaler INHALE TWO PUFFS INTO THE LUNGS EVERY 4 HOURS AS NEEDED FOR WHEEZING/SOB 03/03/18   Kozlow, Alvira Philips, MD  SUMAtriptan (IMITREX) 100 MG tablet Take at 1 tablet onset of severe migraine.  May repeat in 2 hours if headache persists or recurs.  Limit to 2 tablets in 24 hr period. 10/24/18   Nita Sickle K, DO  SYMBICORT 160-4.5 MCG/ACT inhaler Inhale two puffs twice daily to prevent cough or wheeze.  Rinse, gargle, and spit after use. 08/04/19   Padgett, Pilar Grammes, MD  triamcinolone ointment (KENALOG) 0.1 % Can apply to red lesions twice daily as directed. 08/04/19   Marcelyn Bruins, MD  venlafaxine XR (EFFEXOR-XR) 75 MG 24 hr capsule Take 75 mg by mouth daily. 05/15/20   [provider]    Allergies    Other, Advair diskus [fluticasone-salmeterol], Prednisone, and Qvar [beclomethasone]  Review of Systems   Review of Systems  Constitutional: Positive for fever. Negative for chills.  HENT: Negative for ear pain and sore throat.   Eyes: Negative for pain and visual disturbance.  Respiratory: Positive for cough and wheezing. Negative for shortness of breath.   Cardiovascular: Negative for chest pain and palpitations.  Gastrointestinal: Negative for abdominal pain and vomiting.  Genitourinary: Negative for enuresis and hematuria.  Musculoskeletal: Negative for arthralgias and back pain.  Skin: Negative for color change and rash.  Neurological: Negative for seizures and syncope.   All other systems reviewed and are negative.   Physical Exam Updated Vital Signs BP 112/77 (BP Location: Left Arm)   Pulse 84   Temp 98.8 F (37.1 C)   Resp 18   LMP 06/21/2020 (Exact Date)   SpO2 100%   Physical Exam Vitals and nursing note reviewed.  Constitutional:      General: She is not in acute distress.    Appearance: She is well-developed.     Comments: Well-appearing, resting comfortably in ER bed  HENT:     Head: Normocephalic and atraumatic.  Eyes:     Conjunctiva/sclera: Conjunctivae normal.  Cardiovascular:     Rate and Rhythm: Normal rate and regular rhythm.     Heart sounds: No murmur heard.   Pulmonary:     Effort: Pulmonary effort is normal. No respiratory distress.  Breath sounds: Wheezing present.     Comments: Scattered wheezes throughout all 4 lung fields.  Patient speaking in full sentences, with no increased work of breathing.  No audible wheezes. Abdominal:     Palpations: Abdomen is soft.     Tenderness: There is no abdominal tenderness.  Musculoskeletal:        General: Normal range of motion.     Cervical back: Neck supple.  Skin:    General: Skin is warm and dry.  Neurological:     General: No focal deficit present.     Mental Status: She is alert and oriented to person, place, and time.  Psychiatric:        Mood and Affect: Mood normal.        Behavior: Behavior normal.     ED Results / Procedures / Treatments   Labs (all labs ordered are listed, but only abnormal results are displayed) Labs Reviewed  SARS CORONAVIRUS 2 (TAT 6-24 HRS)    EKG None  Radiology DG Chest 2 View  Result Date: 06/23/2020 CLINICAL DATA:  Dry cough and chest pain. EXAM: CHEST - 2 VIEW COMPARISON:  May 14, 2016 FINDINGS: The heart size and mediastinal contours are within normal limits. Both lungs are clear. The visualized skeletal structures are unremarkable. IMPRESSION: No active cardiopulmonary disease. Electronically Signed   By: Aram Candela M.D.   On: 06/23/2020 16:35    Procedures Procedures   Medications Ordered in ED Medications - No data to display  ED Course  I have reviewed the triage vital signs and the nursing notes.  Pertinent labs & imaging results that were available during my care of the patient were reviewed by me and considered in my medical decision making (see chart for details).    MDM Rules/Calculators/A&P                          Patient here with complaints of pneumonia, however not evident here on chest x-ray.  She recently completed a course of Augmentin.  On arrival, afebrile, not tachycardic, tachypneic or hypoxic.  Very well-appearing.  Speaking full sentences without evidence of respiratory distress.  Lung exam with scattered wheezes throughout.  Low suspicion for PE at this time given patient's extensive history of asthma, and no evidence of pneumonia on exam.  She will be swabbed for Covid today, and was told to follow-up via MyChart.  Will prescribe her Medrol Dosepak for asthma exacerbation.  Patient has a listed allergy to prednisone, however patient reports tolerating Medrol Dosepak without difficulty.  Patient states that she has an albuterol inhaler at home and has been using it as directed.  No evidence of life-threatening asthma exacerbation.  Stressed follow-up with pulmonology, return precautions discussed.  She voiced understanding and is agreeable.   Final Clinical Impression(s) / ED Diagnoses Final diagnoses:  Mild intermittent asthma with exacerbation    Rx / DC Orders ED Discharge Orders         Ordered    methylPREDNISolone (MEDROL DOSEPAK) 4 MG TBPK tablet  Status:  Discontinued        06/23/20 1755    methylPREDNISolone (MEDROL DOSEPAK) 4 MG TBPK tablet        06/23/20 1808           Mare Ferrari, PA-C 06/23/20 1816    Margarita Grizzle, MD 06/27/20 1409

## 2020-06-23 NOTE — Discharge Instructions (Addendum)
Please take the Medrol Dosepak as directed until finished.  Please make sure to follow-up with pulmonology.  Your Covid results will be available within the next 24 to 48 hours via the MyChart app.  There are instructions on your discharge paperwork on how to download this.  Return to the ER for any new or worsening symptoms.

## 2020-06-23 NOTE — ED Triage Notes (Signed)
Pt reports being dx with pneumonia at [facility] in [place], having increased coughing since then. Got bit by a dog last week and was already taking a course of Augmentin, so was not given additional abx.

## 2020-06-23 NOTE — ED Notes (Signed)
EKG given to Dr Ray

## 2020-07-04 ENCOUNTER — Encounter (HOSPITAL_COMMUNITY): Payer: Self-pay

## 2020-07-04 ENCOUNTER — Other Ambulatory Visit: Payer: Self-pay

## 2020-07-04 ENCOUNTER — Emergency Department
Admission: EM | Admit: 2020-07-04 | Discharge: 2020-07-04 | Disposition: A | Discharge status: Home / Self Care | Payer: MEDICAID | Attending: Emergency Medicine | Admitting: Emergency Medicine

## 2020-07-04 DIAGNOSIS — Z6822 Body mass index (BMI) 22.0-22.9, adult: Secondary | ICD-10-CM

## 2020-07-04 DIAGNOSIS — N739 Female pelvic inflammatory disease, unspecified: Secondary | ICD-10-CM | POA: Insufficient documentation

## 2020-07-04 LAB — VAGINITIS PANEL
CANDIDA SPECIES: NEGATIVE
GARDNERELLA VAGINALIS: NEGATIVE
TRICHOMONAS VAGINALIS: NEGATIVE

## 2020-07-04 LAB — URINALYSIS, MICROSCOPIC

## 2020-07-04 LAB — URINALYSIS, MACROSCOPIC
BILIRUBIN: NEGATIVE mg/dL
GLUCOSE: NEGATIVE mg/dL
KETONES: NEGATIVE mg/dL
NITRITE: NEGATIVE
PH: 5.5 (ref 5.0–8.0)
PROTEIN: NEGATIVE mg/dL
SPECIFIC GRAVITY: 1.008 (ref 1.002–1.030)
UROBILINOGEN: <2 mg/dL (ref <2.0)

## 2020-07-04 LAB — CBC WITH DIFF
BASOPHIL #: 0 10*3/uL (ref 0.00–0.30)
BASOPHIL %: 1 % (ref 0–1)
EOSINOPHIL #: 0 10*3/uL (ref 0.00–0.50)
EOSINOPHIL %: 1 % (ref 0–4)
HCT: 41.6 % (ref 37.0–47.0)
HGB: 14.4 g/dL (ref 12.5–16.0)
LYMPHOCYTE #: 2.4 10*3/uL (ref 0.90–4.80)
LYMPHOCYTE %: 34 % (ref 23–35)
MCH: 30.9 pg (ref 28.0–33.0)
MCHC: 34.6 g/dL (ref 32.0–37.0)
MCV: 89.2 fL (ref 78.0–100.0)
MONOCYTE #: 0.3 10*3/uL (ref 0.30–0.90)
MONOCYTE %: 4 % (ref 0–12)
NEUTROPHIL #: 4.2 10*3/uL (ref 1.70–7.00)
NEUTROPHIL %: 61 % (ref 50–70)
PLATELETS: 333 10*3/uL (ref 130–400)
RBC: 4.66 10*6/uL (ref 4.20–5.40)
RDW: 13.1 % (ref 9.9–16.5)
WBC: 6.9 10*3/uL (ref 4.5–11.0)

## 2020-07-04 LAB — COMPREHENSIVE METABOLIC PANEL, NON-FASTING
ALBUMIN: 4.3 g/dL (ref 3.5–5.0)
ALKALINE PHOSPHATASE: 72 U/L (ref 40–110)
ALT (SGPT): 13 U/L (ref 8–22)
ANION GAP: 11 mmol/L (ref 4–13)
AST (SGOT): 14 U/L (ref 8–45)
BILIRUBIN TOTAL: 0.5 mg/dL (ref 0.3–1.3)
BUN/CREA RATIO: 12 (ref 6–22)
BUN: 8 mg/dL (ref 8–25)
CALCIUM: 10.3 mg/dL — ABNORMAL HIGH (ref 8.5–10.0)
CHLORIDE: 105 mmol/L (ref 96–111)
CO2 TOTAL: 24 mmol/L (ref 22–30)
CREATININE: 0.67 mg/dL (ref 0.60–1.05)
ESTIMATED GFR: >90 mL/min/BSA (ref >=60)
GLUCOSE: 93 mg/dL (ref 65–125)
POTASSIUM: 3.8 mmol/L (ref 3.5–5.1)
PROTEIN TOTAL: 7.9 g/dL (ref 6.4–8.3)
SODIUM: 140 mmol/L (ref 136–145)

## 2020-07-04 LAB — HCG QUALITATIVE PREGNANCY, SERUM: PREGNANCY, SERUM QUALITATIVE: NEGATIVE

## 2020-07-04 LAB — LIPASE: LIPASE: 34 U/L (ref 10–60)

## 2020-07-04 MED ORDER — DOXYCYCLINE HYCLATE 100 MG CAPSULE
100.0000 mg | ORAL_CAPSULE | ORAL | Status: AC
Start: 2020-07-04 — End: 2020-07-04
  Administered 2020-07-04: 100 mg via ORAL
  Filled 2020-07-04: qty 1

## 2020-07-04 MED ORDER — METRONIDAZOLE 500 MG TABLET
500.0000 mg | ORAL_TABLET | ORAL | Status: AC
Start: 2020-07-04 — End: 2020-07-04
  Administered 2020-07-04: 500 mg via ORAL
  Filled 2020-07-04: qty 1

## 2020-07-04 MED ORDER — SACCHAROMYCES BOULARDII 250 MG CAPSULE
250.0000 mg | ORAL_CAPSULE | Freq: Two times a day (BID) | ORAL | 0 refills | Status: AC
Start: 2020-07-04 — End: 2020-07-18

## 2020-07-04 MED ORDER — DOXYCYCLINE HYCLATE 100 MG CAPSULE
100.0000 mg | ORAL_CAPSULE | Freq: Two times a day (BID) | ORAL | 0 refills | Status: DC
Start: 2020-07-04 — End: 2020-07-19

## 2020-07-04 MED ORDER — ONDANSETRON 4 MG DISINTEGRATING TABLET
4.0000 mg | ORAL_TABLET | Freq: Two times a day (BID) | ORAL | 0 refills | Status: AC | PRN
Start: 2020-07-04 — End: 2020-07-18

## 2020-07-04 MED ORDER — METRONIDAZOLE 500 MG TABLET
500.0000 mg | ORAL_TABLET | Freq: Two times a day (BID) | ORAL | 0 refills | Status: DC
Start: 2020-07-04 — End: 2020-07-19

## 2020-07-04 MED ORDER — LIDOCAINE (PF) 10 MG/ML (1 %) INJECTION SOLUTION
500.0000 mg | INTRAMUSCULAR | Status: AC
Start: 2020-07-04 — End: 2020-07-04
  Administered 2020-07-04: 500 mg via INTRAMUSCULAR
  Filled 2020-07-04: qty 10

## 2020-07-04 MED ORDER — ONDANSETRON HCL (PF) 4 MG/2 ML INJECTION SOLUTION
4.0000 mg | INTRAMUSCULAR | Status: AC
Start: 2020-07-04 — End: 2020-07-04
  Administered 2020-07-04: 4 mg via INTRAVENOUS
  Filled 2020-07-04: qty 2

## 2020-07-04 NOTE — ED Provider Notes (Signed)
Emergency Department  Provider Note    Name: Nettye Flegal      Subjective  History of Present Illness    Kelly House is a 20 y.o. female  who presents to the ED today for lower abdominal pain   This has been for about 2 months.  She feels that is in the mid low abdomen.  At times with certain movements it can radiate into the upper abdomen for all across the low abdomen.  It is fairly constant but waxes and wanes in intensity.  It can be dull or sharp.  She has tried Tylenol without much relief.  She uses a NuvaRing for birth control and when she took it out yesterday she felt that her cervix was lower than it usually is.  She has had some white yellow vaginal discharge and vaginal discomfort.  She denies any abnormal odor.  She was last sexually active in January and stated that she did have STD testing about 2-3 weeks after that encounter which was negative.  Later she was having difficulty recalling if she had another encounter after that.  She denies any abnormal urinary symptoms.  She has some fairly constant nausea but denies vomiting.  She thought that maybe she was constipated so she tried a laxative last night and had a soft bowel movement this morning.  She feels her bowel movements have been normal.  She denies any fever.  She has always had some difficulty with her menstrual cycle being irregular or heavy.        Review of Systems    ROS: Other than those specifically stated in the HPI and below, all other systems reviewed and negative.  Review of Systems       Historical Data   Below information reviewed with patient where pertinent:  Past Medical History:   Diagnosis Date   . Anxiety    . Constipation    . Depression    . Kidney stone    . Recurrent UTI        Current Outpatient Medications   Medication Sig   . albuterol sulfate (PROVENTIL OR VENTOLIN OR PROAIR) 90 mcg/actuation Inhalation HFA Aerosol Inhaler Take 1-2 Puffs by inhalation Every 6 hours as needed   . cetirizine (ZYRTEC) 10 mg  Oral Tablet Take 10 mg by mouth Once per day as needed   . docosahexaenoic acid/epa (FISH OIL ORAL) Take by mouth   . doxycycline hyclate (VIBRAMYCIN) 100 mg Oral Capsule Take 1 Capsule (100 mg total) by mouth Twice daily for 14 days   . ELURYNG 0.12-0.015 mg/24 hr Vaginal Ring    . fludrocortisone (FLORINEF) 0.1 mg Oral Tablet Take 1 Tablet (0.1 mg total) by mouth Every morning with breakfast   . fluticasone propionate (FLONASE) 50 mcg/actuation Nasal Spray, Suspension 1 spray(s) (nasal) 2 times per day in each nostril for allergy symptoms   . lamoTRIgine (LAMICTAL) 25 mg Oral Tablet Take 2 Tablets (50 mg total) by mouth Once a day   . loratadine-pseudoephedrine (CLARITIN-D) 5-120 mg Oral Tablet Sustained Release 12 hr Take 1 Tablet by mouth Twice daily   . metroNIDAZOLE (FLAGYL) 500 mg Oral Tablet Take 1 Tablet (500 mg total) by mouth Twice daily for 14 days   . ondansetron (ZOFRAN ODT) 4 mg Oral Tablet, Rapid Dissolve Take 1 Tablet (4 mg total) by mouth Twice per day as needed for Nausea/Vomiting for up to 14 days   . Saccharomyces boulardii (FLORASTOR) 250 mg Oral Capsule Take 1 Capsule (  250 mg total) by mouth Twice daily for 14 days   . ZINC ORAL Take by mouth       Allergies   Allergen Reactions   . Morphine    . Penicillins      Fine rash and itching   . Hydrocodone-Acetaminophen Nausea/ Vomiting       Past Surgical History:   Procedure Laterality Date   . HX ADENOIDECTOMY     . HX TONSILLECTOMY     . HX WISDOM TEETH EXTRACTION         Social History     Tobacco Use   . Smoking status: Never Smoker   . Smokeless tobacco: Never Used   Vaping Use   . Vaping Use: Former   Substance Use Topics   . Alcohol use: Yes   . Drug use: Not Currently     Types: Marijuana              Objective  Physical Exam   Filed Vitals:    07/04/20 1002 07/04/20 1149   BP: 118/81 126/78   Pulse: 92 100   Resp: 16 14   Temp: 37.2 C (98.9 F)        Physical Exam  . Vital signs Reviewed.   . Constitutional: NAD.   Marland Kitchen HENT:   o Head:  Normocephalic and atraumatic   o Mouth/Throat: Oropharynx is clear and moist   o Eyes: EOMI, conjunctivae without discharge bilaterally  o Neck: Trachea midline   . Abdominal: Abdomen soft, tender suprapubic region without rebound or guarding.  No CVA tenderness bilaterally.  . Genitourinary:  Pelvic exam chaperoned by nurse Estill Bamberg.  Thick yellow vaginal discharge coming from cervix.  The cervix near the os is erythematous.  Positive CMT.  No palpable adnexal mass or tenderness.  . Musculoskeletal: No edema, tenderness or deformity  . Skin: warm and dry. No rash, erythema, pallor or cyanosis  . Psychiatric: normal mood and affect. Behavior is normal   . Neurological: Alert&Ox3. Grossly intact         Orders and Results  Patient Data    Labs Reviewed   COMPREHENSIVE METABOLIC PANEL, NON-FASTING - Abnormal; Notable for the following components:       Result Value    CALCIUM 10.3 (*)     All other components within normal limits   URINALYSIS, MACROSCOPIC - Abnormal; Notable for the following components:    APPEARANCE Hazy (*)     LEUKOCYTES Trace (*)     BLOOD Trace (*)     All other components within normal limits   URINALYSIS, MICROSCOPIC - Abnormal; Notable for the following components:    WBCS 5-10 (*)     SQUAMOUS EPITHELIAL 2-5 (*)     BACTERIA Marked (*)     All other components within normal limits   VAGINITIS PANEL - Normal   LIPASE - Normal   HCG QUALITATIVE PREGNANCY, SERUM - Normal   CBC WITH DIFF - Normal   URINE CULTURE,ROUTINE   CBC/DIFF    Narrative:     The following orders were created for panel order CBC/DIFF.  Procedure                               Abnormality         Status                     ---------                               -----------         ------  CBC WITH OJJK[[id number]]                Normal              Final result                 Please view results for these tests on the individual orders.   URINALYSIS, MACROSCOPIC AND MICROSCOPIC W/CULTURE REFLEX     Narrative:     The following orders were created for panel order URINALYSIS, MACROSCOPIC AND MICROSCOPIC W/CULTURE REFLEX.  Procedure                               Abnormality         Status                     ---------                               -----------         ------                     URINALYSIS, MACROSCOPIC[[id number]]      Abnormal            Final result               URINALYSIS, MICROSCOPIC[[id number]]      Abnormal            Final result                 Please view results for these tests on the individual orders.   NEISSERIA GONORRHOEAE RNA, NAAT       Imaging:  No orders to display        Nursing notes, labs, and imaging reviewed.    Orders:  Orders Placed This Encounter   . CANCELED: URINE [place]   . CBC/DIFF   . COMPREHENSIVE METABOLIC PANEL, NON-FASTING   . LIPASE   . URINALYSIS, MACROSCOPIC AND MICROSCOPIC W/CULTURE REFLEX   . HCG QUALITATIVE PREGNANCY, SERUM   . CBC WITH DIFF   . URINALYSIS, MACROSCOPIC   . URINALYSIS, MICROSCOPIC   . CANCELED: NEISSERIA GONORRHOEAE RNA, NAAT   . INSERT & MAINTAIN PERIPHERAL IV ACCESS   . cefTRIAXone (ROCEPHIN) 500 mg in lidocaine (PF) 1% (10 mg/mL) 1.43 mL IM injection   . doxycycline hyclate (VIBRAMYCIN) capsule   . metroNIDAZOLE (FLAGYL) tablet   . metroNIDAZOLE (FLAGYL) 500 mg Oral Tablet   . doxycycline hyclate (VIBRAMYCIN) 100 mg Oral Capsule   . Saccharomyces boulardii (FLORASTOR) 250 mg Oral Capsule   . ondansetron (ZOFRAN ODT) 4 mg Oral Tablet, Rapid Dissolve   . ondansetron (ZOFRAN) 2 mg/mL injection           Medical Decision Making   Medical Decision Making  In brief, Beda Dula is a 20 y.o. female who presented to the ED for lower abdominal pain.        CBC, CMP, lipase unremarkable.  Serum pregnancy test negative.  Urinalysis with trace leukocytes, 5-10 wbc's, marked bacteria.   Pelvic exam concerning for pelvic inflammatory disease.  Vaginitis panel negative for Gardnerella, candidiasis, and Trichomonas.   Gonorrhea and chlamydia testing pending.   Patient was treated with 500 mg of IM Rocephin, p.o. doxycycline and p.o. Flagyl.  Prescriptions for 2 weeks for doxycycline  and Flagyl as well as Zofran and Florastor.  Advised patient to schedule a follow-up appointment with her gynecologist for a recheck in 2 weeks.  I did discuss with the patient that her cervix did appear abnormal.  Her sisters have been diagnosed with cervical cancer previously and her last Pap smear was about 1 year ago.  She agreed to follow-up with gynecology.      Clinical Impression:     Encounter Diagnosis   Name Primary?   . Pelvic inflammatory disease Yes       Disposition: Discharged    Follow up:   Juel Burrow, DO  [place]  [place] [address]  [phone]    Call   As needed    Margarite Gouge, MD  [place]  [address]  [place] [address]  [phone]    Schedule an appointment as soon as possible for a visit in 14 days                     Parts of this patient's chart were completed in a retrospective fashion due to simultaneous direct patient care activities in the Emergency Department.           Procedures

## 2020-07-04 NOTE — ED Nurses Note (Signed)
Rounded on patient.  Reviewed vital signs and treatment plan.  Asked if patient had any needs, especially in the area of toileting, pain management and general comfort.  Addressed issues.  Asked the patient/family if they had any needs before I left the room.  Indicated that I would be back within the hour to evaluate them again and update them on progress.  Call bell within reach.

## 2020-07-04 NOTE — ED Nurses Note (Addendum)
Pt states that "her cervix feels like its falling out."

## 2020-07-04 NOTE — ED Nurses Note (Signed)
Patient discharged home with family.  AVS reviewed with patient/care giver.  A written copy of the AVS and discharge instructions was given to the patient/care giver.  Questions sufficiently answered as needed.  Patient/care giver encouraged to follow up with PCP as indicated.  In the event of an emergency, patient/care giver instructed to call 911 or go to the nearest emergency room.

## 2020-07-04 NOTE — ED Nurses Note (Signed)
Accompanied Arnetha Massy, PA for pelvic exam with swabs.

## 2020-07-04 NOTE — ED Triage Notes (Signed)
Intermittent episodes of lower abdominal pain for the last month, pain is primarily on the lt, accompanied by nausea, soft BM last night after taking laxative

## 2020-07-06 LAB — URINE CULTURE,ROUTINE: URINE CULTURE: NO GROWTH

## 2020-07-07 LAB — NEISSERIA GONORRHOEAE RNA, NAAT: NEISSERIA GONORRHEA GC RNA: NEGATIVE

## 2020-07-19 ENCOUNTER — Encounter (INDEPENDENT_AMBULATORY_CARE_PROVIDER_SITE_OTHER): Payer: Self-pay | Admitting: PHYSICIAN ASSISTANT

## 2020-07-19 ENCOUNTER — Other Ambulatory Visit: Payer: Self-pay

## 2020-07-19 ENCOUNTER — Ambulatory Visit (HOSPITAL_COMMUNITY): Payer: MEDICAID

## 2020-07-19 ENCOUNTER — Ambulatory Visit: Payer: MEDICAID | Attending: PHYSICIAN ASSISTANT | Admitting: PHYSICIAN ASSISTANT

## 2020-07-19 VITALS — Ht 60.0 in

## 2020-07-19 DIAGNOSIS — Z6823 Body mass index (BMI) 23.0-23.9, adult: Secondary | ICD-10-CM

## 2020-07-19 DIAGNOSIS — N926 Irregular menstruation, unspecified: Secondary | ICD-10-CM | POA: Insufficient documentation

## 2020-07-19 DIAGNOSIS — N898 Other specified noninflammatory disorders of vagina: Secondary | ICD-10-CM | POA: Insufficient documentation

## 2020-07-19 DIAGNOSIS — R102 Pelvic and perineal pain: Secondary | ICD-10-CM | POA: Insufficient documentation

## 2020-07-19 LAB — LH: LUTEINIZING HORMONE: 4.4 IU/L

## 2020-07-19 LAB — FSH: FSH: 4.7 IU/L

## 2020-07-19 LAB — THYROID STIMULATING HORMONE WITH FREE T4 REFLEX: TSH: 0.554 u[IU]/mL (ref 0.465–4.000)

## 2020-07-19 LAB — PROLACTIN: PROLACTIN: 17.7 ng/mL (ref 3.0–18.6)

## 2020-07-19 LAB — CHLAMYDIA / NEISSERIA DNA BY PCR
CHLAMYDIA TRACHOMATIS PCR: NOT DETECTED
NEISSERIA GONORRHOEAE PCR: NOT DETECTED

## 2020-07-19 LAB — HCG, PLASMA OR SERUM QUANTITATIVE, PREGNANCY: HCG QUANTITATIVE PREGNANCY: <2 IU/L

## 2020-07-19 NOTE — Patient Instructions (Signed)
Return for annual exam

## 2020-07-19 NOTE — Progress Notes (Signed)
Name: Kelly House  Date: 07/19/2020  Age: 20 y.o.      Chief Complaint   Patient presents with    Pelvic Pain     Went to [facility] ER 2 weeks ago, was having pain and nausea  Cramping, with sharp pains at times   LMP 07-06-2020       HPI: Kelly House is a 20 y.o. year old No obstetric history on file. She was seen at [facility] about 2 weeks ago. She was experiencing pelvic pain and pressure, along with nausea. She described the pain as cramping and sharp stabbing pains in lower pelvic area and vaginal area. She states that the ER treated her with Doxycycline and Metronidazole. They did a vaginal culture and gonorrhea culture, chlamydia was not tested. She did not have an ultrasound. She is still experiencing the same type of pain. She is currently using Nuvaring, she states that every other month her periods are irregular and sometimes heavy     OB History   No obstetric history on file.       Past Medical History:   Past Surgical History:   Procedure Laterality Date    HX ADENOIDECTOMY      HX TONSILLECTOMY      HX WISDOM TEETH EXTRACTION             Family Medical History:       Problem Relation (Age of Onset)    Anxiety Mother    Asthma Brother    Breast Cancer Paternal 62, Paternal 45, Paternal Aunt    Cervical Cancer Sister, Sister    Depression Mother    Heart Disease Mother    Melanoma Maternal Grandmother    Migraines Sister    Prostate Cancer Paternal Grandfather              Social History     Socioeconomic History    Marital status: Single   Tobacco Use    Smoking status: Never Smoker    Smokeless tobacco: Never Used   Brewing technologist Use: Former   Substance and Sexual Activity    Alcohol use: Yes     Comment: 4    Drug use: Not Currently     Types: Marijuana    Sexual activity: Yes     Partners: Male     Birth control/protection: Condom, Pill       Current meds:   Current Outpatient Medications   Medication Sig    albuterol sulfate (PROVENTIL OR VENTOLIN OR  PROAIR) 90 mcg/actuation Inhalation HFA Aerosol Inhaler Take 1-2 Puffs by inhalation Every 6 hours as needed    cetirizine (ZYRTEC) 10 mg Oral Tablet Take 10 mg by mouth Once per day as needed    docosahexaenoic acid/epa (FISH OIL ORAL) Take by mouth    ELURYNG 0.12-0.015 mg/24 hr Vaginal Ring     fludrocortisone (FLORINEF) 0.1 mg Oral Tablet Take 1 Tablet (0.1 mg total) by mouth Every morning with breakfast    fluticasone propionate (FLONASE) 50 mcg/actuation Nasal Spray, Suspension 1 spray(s) (nasal) 2 times per day in each nostril for allergy symptoms    loratadine-pseudoephedrine (CLARITIN-D) 5-120 mg Oral Tablet Sustained Release 12 hr Take 1 Tablet by mouth Twice daily    ZINC ORAL Take by mouth       Allergies:  Allergies   Allergen Reactions    Morphine     Penicillins      Fine rash and itching  Hydrocodone-Acetaminophen Nausea/ Vomiting       PE:   External: normal  Vagina: small amount of thin, white discharge was noted- vaginal culture done  Cervix: normal, no discharge- GC/Chlamydia probe done  Uterus: anteverted, normal size, patient experienced tenderness in mid pelvic area on palpation   Adnexae: normal   Vitals:    07/19/20 1132   Height: 1.524 m (5')         Assessment:   2 ICD-10-CM    1. Pelvic pain  R10.2 [place] FEMALE PELVIS     GENITAL CULTURE     CHLAMYDIA / NEISSERIA DNA BY PCR   2. Irregular periods  N92.6 FSH     LH     PROLACTIN     THYROID STIMULATING HORMONE WITH FREE T4 REFLEX     HCG, PLASMA OR SERUM QUANTITATIVE, PREGNANCY   3. Vaginal discharge  N89.8 GENITAL CULTURE     CHLAMYDIA / NEISSERIA DNA BY PCR       Plan:   Await culture results to treat  Pelvic ultrasound   FSH, LH, prolactin, TSH reflex and serum pregnancy test ordered  Call for results\  Follow up at next annual exam or sooner if needed     [place], PA-C    The PA saw the patient independently. I reviewed the PA's note. I agree with the findings and plan of care in the PA's note. Any exceptions/additions are  edited above.     Margarite Gouge, MD  07/19/2020, 17:53

## 2020-07-20 LAB — GENITAL CULTURE

## 2020-07-21 ENCOUNTER — Other Ambulatory Visit (INDEPENDENT_AMBULATORY_CARE_PROVIDER_SITE_OTHER): Payer: Self-pay | Admitting: PHYSICIAN ASSISTANT

## 2020-07-21 DIAGNOSIS — N760 Acute vaginitis: Secondary | ICD-10-CM

## 2020-07-21 LAB — GENITAL CULTURE

## 2020-07-21 MED ORDER — CIPROFLOXACIN 500 MG TABLET
500.0000 mg | ORAL_TABLET | Freq: Two times a day (BID) | ORAL | 0 refills | Status: AC
Start: 2020-07-21 — End: 2020-07-28

## 2020-07-22 ENCOUNTER — Encounter (INDEPENDENT_AMBULATORY_CARE_PROVIDER_SITE_OTHER): Payer: Self-pay | Admitting: Family

## 2020-07-22 LAB — GENITAL CULTURE: GRAM STAIN: NORMAL

## 2020-07-27 ENCOUNTER — Ambulatory Visit
Admission: RE | Admit: 2020-07-27 | Discharge: 2020-07-27 | Disposition: A | Discharge status: Home / Self Care | Payer: MEDICAID | Source: Ambulatory Visit | Attending: PHYSICIAN ASSISTANT | Admitting: PHYSICIAN ASSISTANT

## 2020-07-27 ENCOUNTER — Other Ambulatory Visit: Payer: Self-pay

## 2020-07-27 DIAGNOSIS — R102 Pelvic and perineal pain: Secondary | ICD-10-CM

## 2020-08-22 ENCOUNTER — Emergency Department
Admission: EM | Admit: 2020-08-22 | Discharge: 2020-08-22 | Disposition: A | Discharge status: Home / Self Care | Payer: MEDICAID | Attending: Physician Assistant | Admitting: Physician Assistant

## 2020-08-22 ENCOUNTER — Telehealth (INDEPENDENT_AMBULATORY_CARE_PROVIDER_SITE_OTHER): Payer: Self-pay | Admitting: Family

## 2020-08-22 ENCOUNTER — Emergency Department (HOSPITAL_COMMUNITY): Payer: MEDICAID

## 2020-08-22 ENCOUNTER — Encounter (HOSPITAL_COMMUNITY): Payer: Self-pay

## 2020-08-22 ENCOUNTER — Other Ambulatory Visit: Payer: Self-pay

## 2020-08-22 DIAGNOSIS — Z6823 Body mass index (BMI) 23.0-23.9, adult: Secondary | ICD-10-CM

## 2020-08-22 DIAGNOSIS — R1012 Left upper quadrant pain: Secondary | ICD-10-CM | POA: Insufficient documentation

## 2020-08-22 DIAGNOSIS — R112 Nausea with vomiting, unspecified: Secondary | ICD-10-CM

## 2020-08-22 LAB — HEPATIC FUNCTION PANEL
ALBUMIN: 3.9 g/dL (ref 3.5–5.0)
ALKALINE PHOSPHATASE: 79 U/L (ref 40–110)
ALT (SGPT): 13 U/L (ref 8–22)
AST (SGOT): 14 U/L (ref 8–45)
BILIRUBIN DIRECT: 0.2 mg/dL (ref 0.1–0.4)
BILIRUBIN TOTAL: 0.5 mg/dL (ref 0.3–1.3)
PROTEIN TOTAL: 7.1 g/dL (ref 6.4–8.3)

## 2020-08-22 LAB — URINALYSIS, MACROSCOPIC
BILIRUBIN: NEGATIVE mg/dL
BLOOD: NEGATIVE mg/dL
GLUCOSE: NEGATIVE mg/dL
KETONES: NEGATIVE mg/dL
NITRITE: NEGATIVE
PH: 6.5 (ref 5.0–8.0)
PROTEIN: 10 mg/dL
SPECIFIC GRAVITY: 1.023 (ref 1.002–1.030)
UROBILINOGEN: <2 mg/dL (ref <2.0)

## 2020-08-22 LAB — URINALYSIS, MICROSCOPIC

## 2020-08-22 LAB — CBC WITH DIFF
BASOPHIL #: 0 10*3/uL (ref 0.00–0.30)
BASOPHIL %: 1 % (ref 0–1)
EOSINOPHIL #: 0 10*3/uL (ref 0.00–0.50)
EOSINOPHIL %: 0 % (ref 0–4)
HCT: 39.8 % (ref 37.0–47.0)
HGB: 13.3 g/dL (ref 12.5–16.0)
LYMPHOCYTE #: 1.5 10*3/uL (ref 0.90–4.80)
LYMPHOCYTE %: 18 % — ABNORMAL LOW (ref 23–35)
MCH: 29.5 pg (ref 28.0–33.0)
MCHC: 33.5 g/dL (ref 32.0–37.0)
MCV: 88 fL (ref 78.0–100.0)
MONOCYTE #: 0.3 10*3/uL (ref 0.30–0.90)
MONOCYTE %: 4 % (ref 0–12)
NEUTROPHIL #: 6.5 10*3/uL (ref 1.70–7.00)
NEUTROPHIL %: 78 % — ABNORMAL HIGH (ref 50–70)
PLATELETS: 295 10*3/uL (ref 130–400)
RBC: 4.52 10*6/uL (ref 4.20–5.40)
RDW: 13 % (ref 9.9–16.5)
WBC: 8.3 10*3/uL (ref 4.5–11.0)

## 2020-08-22 LAB — HCG QUALITATIVE PREGNANCY, SERUM: PREGNANCY, SERUM QUALITATIVE: NEGATIVE

## 2020-08-22 LAB — BASIC METABOLIC PANEL
ANION GAP: 10 mmol/L (ref 4–13)
BUN/CREA RATIO: 12 (ref 6–22)
BUN: 8 mg/dL (ref 8–25)
CALCIUM: 9.3 mg/dL (ref 8.5–10.0)
CHLORIDE: 107 mmol/L (ref 96–111)
CO2 TOTAL: 23 mmol/L (ref 22–30)
CREATININE: 0.68 mg/dL (ref 0.60–1.05)
ESTIMATED GFR: >90 mL/min/BSA (ref >=60)
GLUCOSE: 88 mg/dL (ref 65–125)
POTASSIUM: 3.8 mmol/L (ref 3.5–5.1)
SODIUM: 140 mmol/L (ref 136–145)

## 2020-08-22 LAB — LIPASE: LIPASE: 36 U/L (ref 10–60)

## 2020-08-22 MED ORDER — ONDANSETRON HCL 4 MG TABLET
4.0000 mg | ORAL_TABLET | Freq: Three times a day (TID) | ORAL | 0 refills | Status: AC | PRN
Start: 2020-08-22 — End: 2020-09-12

## 2020-08-22 MED ORDER — ONDANSETRON HCL (PF) 4 MG/2 ML INJECTION SOLUTION
4.0000 mg | INTRAMUSCULAR | Status: AC
Start: 2020-08-22 — End: 2020-08-22
  Administered 2020-08-22 (×2): 4 mg via INTRAVENOUS
  Filled 2020-08-22: qty 2

## 2020-08-22 MED ORDER — IOPAMIDOL 300 MG IODINE/ML (61 %) INTRAVENOUS SOLUTION
75.0000 mL | INTRAVENOUS | Status: AC
Start: 2020-08-22 — End: 2020-08-22
  Administered 2020-08-22: 75 mL via INTRAVENOUS

## 2020-08-22 MED ORDER — SODIUM CHLORIDE 0.9 % IV BOLUS
1000.0000 mL | INJECTION | Status: AC
Start: 2020-08-22 — End: 2020-08-22
  Administered 2020-08-22: 0 mL via INTRAVENOUS
  Administered 2020-08-22: 1000 mL via INTRAVENOUS
  Filled 2020-08-22: qty 1000

## 2020-08-22 NOTE — ED Provider Notes (Signed)
[facility]  Department of Emergency Medicine  Provider Note  08/22/2020      Advanced Practice Provider: Seymour Bars, PA-C  Attending Physician: Dr. Honor Junes     Chief Complaint: abdominal pain    HPI  Kelly House is a 20 y.o. female who presents to the ED via [place] with c/o abdominal pain.   . Pain is located to the LUQ abdomen. It started this AM and has since been constant. No radiation of pain. Nothing specific makes pain worse. Nothing specific improves pain.   . Associated symptoms include nausea and vomiting.  . Pt denies any fever, urinary or stool changes.  . Was seen at [facility] and sent to ED for CT scan.  . Of note, pt states she has been getting episodes of pain like this for a while now. States that it happens around the same time monthly and resolves spontaneously.     Patient denies any other symptoms or complaints.    History Limitations: None.       Review of Systems  Constitutional: No fever, chills or weakness  Skin: No rash or diaphoresis  HENT: No headaches or congestion  Cardio: No chest pain, palpitations or leg swelling   Respiratory: No cough, wheezing or SOB  GI:  No stool changes +abdominal pain +nausea/vomiting  GU:  No urinary changes  MSK: No joint or back pain  Neuro: No seizures or LOC  All other systems reviewed and are negative.    History:   PMH:    Past Medical History:   Diagnosis Date   . Anxiety    . Constipation    . Depression    . Kidney stone    . Recurrent UTI          PSH:    Past Surgical History:   Procedure Laterality Date   . HX ADENOIDECTOMY     . HX TONSILLECTOMY     . HX WISDOM TEETH EXTRACTION           Social Hx:    Social History     Socioeconomic History   . Marital status: Single     Spouse name: Not on file   . Number of children: Not on file   . Years of education: Not on file   . Highest education level: Not on file   Occupational History   . Not on file   Tobacco Use   . Smoking status: Never Smoker   . Smokeless tobacco: Never  Used   Vaping Use   . Vaping Use: Former   Substance and Sexual Activity   . Alcohol use: Yes     Comment: 4   . Drug use: Not Currently     Types: Marijuana   . Sexual activity: Yes     Partners: Male     Birth control/protection: Condom, Pill   Other Topics Concern   . Not on file   Social History Narrative   . Not on file     Social Determinants of Health     Financial Resource Strain: Not on file   Food Insecurity: Not on file   Transportation Needs: Not on file   Physical Activity: Not on file   Stress: Not on file   Intimate Partner Violence: Not on file   Housing Stability: Not on file     Family Hx:   Family History   Problem Relation Age of Onset   . Asthma Brother    .  Migraines Sister    . Cervical Cancer Sister    . Heart Disease Mother    . Anxiety Mother    . Depression Mother    . Breast Cancer Paternal Aunt    . Melanoma Maternal Grandmother    . Breast Cancer Paternal Grandmother    . Prostate Cancer Paternal Grandfather    . Cervical Cancer Sister    . Breast Cancer Paternal Aunt      Allergies:   Allergies   Allergen Reactions   . Morphine    . Penicillins      Fine rash and itching   . Hydrocodone-Acetaminophen Nausea/ Vomiting       Physical Exam   Filed Vitals:    08/22/20 1034 08/22/20 1518   BP: 104/65 98/65   Pulse: 82 76   Resp: 16 17   Temp: 36.4 C (97.5 F)    SpO2: 98% 97%        Constitutional: NAD. Oriented.  HENT:   Head: Normocephalic and atraumatic.   Mouth/Throat: Mucous membranes moist.  Eyes: EOMI.  Neck: Trachea midline. Neck supple.  Cardiovascular: Regular rate and rhythm. No murmurs. Intact distal pulses.  Pulmonary/Chest: BS equal bilaterally. No respiratory distress. No wheezes, rales, or rhonchi.  GI: Abdomen soft. Mild LUQ TTP. No rebound or guarding.         Musculoskeletal: No edema, tenderness, or deformity.  Skin: Warm and dry. No rash, erythema, pallor, or cyanosis.  Psychiatric: Normal mood and affect. Behavior is normal.   Neurological: Alert. Grossly  intact.      Course  Orders, Abnormal Labs and Imaging Results:  Results up to the Time the Disposition was Entered   CBC WITH DIFF - Abnormal; Notable for the following components:       Result Value    NEUTROPHIL % 78 (*)     LYMPHOCYTE % 18 (*)     All other components within normal limits   URINALYSIS, MACROSCOPIC - Abnormal; Notable for the following components:    APPEARANCE Hazy (*)     LEUKOCYTES Trace (*)     All other components within normal limits   URINALYSIS, MICROSCOPIC - Abnormal; Notable for the following components:    WBCS 2-5 (*)     SQUAMOUS EPITHELIAL 5-10 (*)     BACTERIA Slight (*)     All other components within normal limits   BASIC METABOLIC PANEL - Normal   HEPATIC FUNCTION PANEL - Normal   LIPASE - Normal   HCG QUALITATIVE PREGNANCY, SERUM - Normal   URINE CULTURE,ROUTINE - Normal   CBC/DIFF    Narrative:     The following orders were created for panel order CBC/DIFF.  Procedure                               Abnormality         Status                     ---------                               -----------         ------                     CBC WITH ZPHX[[id number]]  Abnormal            Final result                 Please view results for these tests on the individual orders.   URINALYSIS, MACROSCOPIC AND MICROSCOPIC W/CULTURE REFLEX    Narrative:     The following orders were created for panel order URINALYSIS, MACROSCOPIC AND MICROSCOPIC W/CULTURE REFLEX.  Procedure                               Abnormality         Status                     ---------                               -----------         ------                     URINALYSIS, MACROSCOPIC[[id number]]      Abnormal            Final result               URINALYSIS, MICROSCOPIC[[id number]]      Abnormal            Final result                 Please view results for these tests on the individual orders.   CT ABDOMEN PELVIS W IV CONTRAST    Narrative:     Kelly House    RADIOLOGIST: Dorisann Frames, MD    CT  ABDOMEN PELVIS W IV CONTRAST performed on 08/22/2020 2:43 PM    CLINICAL HISTORY: LUQ abdominal pain.  Pt c/o generalized abd pain with N/V x 1 day.     TECHNIQUE:  Abdomen and pelvis CT with intravenous contrast.  IV CONTRAST: 75 ml's of Isovue 300    COMPARISON:  04/12/2011  # of known CTs in the past 12 months: 0   # of known Cardiac Nuclear Medicine Studies in the past 12 months: 0    FINDINGS:  Lung bases: Clear    Liver:   Unremarkable.    Gallbladder:   Unremarkable.    Spleen:   Normal size. Homogeneous enhancement.    Pancreas:   Unremarkable.    Adrenals:   Unremarkable.    Kidneys:   Unremarkable.    Bladder:  Unremarkable.    Uterus and Adnexa:  Unremarkable. Ring shaped radiolucent intravaginal device.    Bowel:   No obstruction. No diverticulitis.  Appendix:  Normal.    Lymph nodes:  No suspicious lymph node enlargement.  Vasculature:   Major vascular structures are unremarkable.   Peritoneum / Retroperitoneum: Small volume free fluid in the pelvis, nonspecific and usually physiologic in a female patient of this age.    Bones:   Unremarkable.       ondansetron (ZOFRAN) 2 mg/mL injection (4 mg Intravenous Given 08/22/20 1243)   NS bolus infusion 1,000 mL (0 mL Intravenous Stopped 08/22/20 1313)   iopamidol (ISOVUE-300) 61% infusion (75 mL Intravenous Given 08/22/20 1444)       MDM:   ED Course as of 08/26/20 1217   Mon Aug 22, 2020   1455 WBC: 8.3 [AR]   1455 PREGNANCY, SERUM QUALITATIVE: Negative [  AR]   1455 URINALYSIS, MACROSCOPIC AND MICROSCOPIC W/CULTURE REFLEX(!)  Trace leukocytes, 2-5 wbc's, slight bacteria.  5-10 squamous epithelial cells.    Likely contaminated sample.  Will allow to culture. [AR]   1458 CT ABDOMEN PELVIS W IV CONTRAST  IMPRESSION:  1.MINIMAL FREE FLUID IN THE PELVIS, LIKELY PHYSIOLOGIC.  2.OTHERWISE UNREMARKABLE ABDOMEN AND PELVIS CT.  [AR]      ED Course User Index  [AR] Seymour Bars, PA-C     Devri Kreher is a 20 y.o. female who presents with above stated complaints.  Patient  seen and examined.   Labs/imaging obtained and reviewed. Pertinent results as documented above.   All results discussed with patient/family.   Vitals: Stable throughout stay.  Meds given while in ED: Zofran and NS bolus  Upon reexamination, pt with improvement of symptoms.    Consults: None  Plan: Discharge with Rx for Zofran. Will refer to GI for further evaluation of reoccurring symptoms.    Patient/family agreeable to plan.   Patient/family given the opportunity to ask questions and denies any questions/concerns at this time.       Impression:   Encounter Diagnoses   Name Primary?   . Left upper quadrant abdominal pain Yes   . Nausea and vomiting      Disposition:  Discharged  Discharge: Following the above history, physical exam, and studies, the patient was deemed stable and suitable for discharge.Medication instructions were discussed with patient/family, and patient/family given a chance to ask questions. It was advised that the patient return to the ED if they develop new or any other concerning symptoms and follow up as directed. The patient/family verbalized understanding of all instructions and had no further questions or concerns.       Follow Up:   [facility] [place] [address]    [phone]  Schedule an appointment as soon as possible for a visit       [facility] - Emergency Department  [place] [address]  [phone]    As needed, If symptoms worsen    Prescriptions:   Discharge Medication List as of 08/24/2020  3:23 PM      START taking these medications    Details   ondansetron (ZOFRAN) 4 mg Oral Tablet Take 1 Tablet (4 mg total) by mouth Every 8 hours as needed for Nausea/Vomiting for up to 7 days, Disp-21 Tablet, R-0, E-Rx              Future Appointments   Date Time Provider [place]   09/12/2020  2:20 PM Haught, Tomi Bamberger, DO [id number]       Claris Pong Wyonia Hough, PA-C 08/26/2020, 12:20      This  chart may have been completed after the conclusion of this patient's care due to the time constraints of simultaneous responsibilities of direct patient care activities during the clinical shift in the emergency department.   This note was partially generated using MModal Fluency Direct system, and there may be some incorrect words, spellings, and punctuation that were not noted in checking the note before saving.

## 2020-08-22 NOTE — Telephone Encounter (Signed)
Patient cancelled >30 days ago.  They were unable to be reached by phone.  We will keep track of this.  If you decide you want a term letter sent, please let me know

## 2020-08-22 NOTE — ED Triage Notes (Signed)
20yr old female present with C/O abd pain, N/V since this am, was seen at [facility] and sent to ED for a CT

## 2020-08-24 LAB — URINE CULTURE,ROUTINE: URINE CULTURE: NO GROWTH

## 2020-08-24 NOTE — ED Nurses Note (Signed)
Faxed work excuse to [employer] for 08/22/20

## 2020-09-12 ENCOUNTER — Ambulatory Visit: Payer: MEDICAID | Attending: Family Medicine | Admitting: Family Medicine

## 2020-09-12 ENCOUNTER — Other Ambulatory Visit: Payer: Self-pay

## 2020-09-12 ENCOUNTER — Encounter (HOSPITAL_BASED_OUTPATIENT_CLINIC_OR_DEPARTMENT_OTHER): Payer: Self-pay | Admitting: Family Medicine

## 2020-09-12 ENCOUNTER — Ambulatory Visit (HOSPITAL_BASED_OUTPATIENT_CLINIC_OR_DEPARTMENT_OTHER): Payer: MEDICAID

## 2020-09-12 VITALS — BP 110/80 | HR 74 | Ht 60.0 in | Wt 120.4 lb

## 2020-09-12 DIAGNOSIS — R109 Unspecified abdominal pain: Secondary | ICD-10-CM

## 2020-09-12 DIAGNOSIS — G43909 Migraine, unspecified, not intractable, without status migrainosus: Secondary | ICD-10-CM | POA: Insufficient documentation

## 2020-09-12 DIAGNOSIS — Z6823 Body mass index (BMI) 23.0-23.9, adult: Secondary | ICD-10-CM

## 2020-09-12 NOTE — Progress Notes (Signed)
FAM[facility]ICINE, [address] [facility]  [place] [address]  Operated by [facility]     Name: Kelly House MRN:  [id number]   Date: 09/12/2020 Age: 20 y.o.     Assessment and Plan:    ICD-10-CM    1. Abdominal pain, unspecified abdominal location  R10.9 Refer to [place], [facility] 3     Celiac Screening Profile, IGA with Reflex to IGG, Serum   2. Migraine  G43.909      Medication Orders   No Medications ordered       Continue follow with OB Gyne as well as Behavioral Health as per recommendation  Will check IgA level for possible celiac the refer to [facility] for possible pan endoscopies.  Going to add in fiber supplement as well as limit gluten until visit.  Advised to go the ER if abdominal pain were to return  No follow-ups on file.      Chief Complaint            ED Follow-up           HPI: Kelly House is a 20 y.o. female who presents today with ED Follow-up    08/22/2020 for ED follow-up for abdominal pain.  CT of the abdomen pelvis revealed no acute disease.  She had a pelvic ultrasound on 07/27/2020 which was also normal.  Previous history of migraines.  She has tried to eliminate her diet with dairy which did not help.  She describes 1 week prior to starting her period that she will get severe 10/10 cramping nausea as well as vomiting and diarrhea.  Has been checked out by [facility] Gy[facility] and told nothing gynecological.  She is on the NuvaRing.  Nursing Notes:   Merlinda [place], [place]  09/12/20 [place] here today for ed f/u, states she has had lower/ mid abdominal pain, [facility] for the past few months, nothing can be found.  states w/ these pains, she has diarrhea/ vomiting, she had tried not eating eggs, diary an gluten.   wants GI referral      History:  Vital signs and history as obtained by clinical staff.  Past Medical History:   Diagnosis Date   . Anxiety    . Constipation     . Depression    . Kidney stone    . Recurrent UTI          Past Surgical History:   Procedure Laterality Date   . HX ADENOIDECTOMY     . HX TONSILLECTOMY     . HX WISDOM TEETH EXTRACTION           Family Medical History:     Problem Relation (Age of Onset)    Anxiety Mother    Asthma Brother    Breast Cancer Paternal 69, Paternal Grandmother, Paternal Aunt    Cervical Cancer Sister, Sister    Depression Mother    Heart Disease Mother    Melanoma Maternal Grandmother    Migraines Sister    Prostate Cancer Paternal Grandfather          Social History     Socioeconomic History   . Marital status: Single   Tobacco Use   . Smoking status: Never Smoker   . Smokeless tobacco: Never Used   Vaping Use   . Vaping Use: Former   Substance and Sexual Activity   . Alcohol  use: Yes     Comment: 4   . Drug use: Not Currently     Types: Marijuana   . Sexual activity: Yes     Partners: Male     Birth control/protection: Condom, Pill     Expanded Substance History     Additional history       Allergies:  Allergies   Allergen Reactions   . Morphine    . Penicillins      Fine rash and itching   . Hydrocodone-Acetaminophen Nausea/ Vomiting     Problem List:  Patient Active Problem List    Diagnosis   . Asthma   . Hypertension   . Unspecified acute conjunctivitis, left eye   . Acute suppurative otitis media without spontaneous rupture of ear drum   . Dysfunction of eustachian tube   . Pharyngitis, acute   . Cough   . Infectious disease contact   . Risk of exposure to communicable disease   . Upper respiratory infection, acute   . Gastroenteritis   . Sciatica, left side   . Meningitis   . Headache(784.0)   . Nausea with vomiting     Medication:  Outpatient Encounter Medications as of 09/12/2020   Medication Sig Dispense Refill   . albuterol sulfate (PROVENTIL OR VENTOLIN OR PROAIR) 90 mcg/actuation Inhalation HFA Aerosol Inhaler Take 1-2 Puffs by inhalation Every 6 hours as needed 1 Each 3   . cetirizine (ZYRTEC) 10 mg Oral Tablet Take  10 mg by mouth Once per day as needed     . Cranberry 500 mg Oral Capsule Take by mouth     . docosahexaenoic acid/epa (FISH OIL ORAL) Take by mouth     . ELURYNG 0.12-0.015 mg/24 hr Vaginal Ring      . fludrocortisone (FLORINEF) 0.1 mg Oral Tablet Take 1 Tablet (0.1 mg total) by mouth Every morning with breakfast 30 Tablet 5   . fluticasone propionate (FLONASE) 50 mcg/actuation Nasal Spray, Suspension 1 spray(s) (nasal) 2 times per day in each nostril for allergy symptoms 16 g 5   . loratadine-pseudoephedrine (CLARITIN-D) 5-120 mg Oral Tablet Sustained Release 12 hr Take 1 Tablet by mouth Twice daily 60 Tablet 0   . ondansetron (ZOFRAN) 4 mg Oral Tablet Take 1 Tablet (4 mg total) by mouth Every 8 hours as needed for Nausea/Vomiting for up to 7 days 21 Tablet 0   . ZINC ORAL Take by mouth       No facility-administered encounter medications on file as of 09/12/2020.       Review of Systems:  Constitutional: No recent illness, no fever, no chills, no night sweats, no weight loss, no loss of appetite.  Neuro: No dizziness. No lightheadedness. No syncope. No hx of seizures.  Eyes: No blurring of vision or diplopia.  ENT: No sore throat. No dysphagia. No odynophagia. No hearing loss. No tinnitus. No rhinorrhea. No sinus pains. No nasal congestion.  Respiratory: No cough. No shortness of breath. No wheezing. No hemoptysis.  Cardiovascular: No chest pain.  No palpitation. No exertional dyspnea.  Muskuloskeletal: No myalgias. No arthralgias.  GI: No abdominal pain. No nausea, vomiting, diarrhea.  GU: No dysuria, frequency of urination.  Skin: No rashes.    Exam:  Vital: BP 110/80   Pulse 74   Ht 1.524 m (5')   Wt 54.6 kg (120 lb 6.4 oz)   SpO2 98%   Breastfeeding No   BMI 23.51 kg/m       General: alert,  cooperative, no distress, appears stated age  Eyes: Conjunctivae/corneas clear, PERRLA.  Ears: Left TM and canal normal.  Right TM and canal normal. External ear canals clear  Nose: Nares normal. Mucosa  normal.  Throat: Pharynx without exudate. No oral lesions.    Lips, mucosa, and tongue normal.  Head - Normocephalic, without obvious abnormality, atraumatic  Neck- supple, symmetrical, trachea midline, no adenopathy  Lungs: clear to auscultation bilaterally. No crackles, no wheezes   Cardiovascular: Heart regular rate and rhythm, no significant murmurs, no carotid bruits  Abdomen: soft, non-tender, non-distended, no hepatosplenomegaly, no masses and no hernias  Extremities: extremities normal, atraumatic, no cyanosis or edema  Skin: Skin color, texture, turgor normal. No rash.  Lymph nodes: Cervical nodes normal.  Neurologic: alert and oriented x3.         Juel Burrow, DO    Portions of this note may be dictated using voice recognition software or a dictation service. Variances in spelling and vocabulary are possible and unintentional. Not all errors are caught/corrected. Please notify the [facility] if any discrepancies are noted or if the meaning of any statement is not clear.

## 2020-09-12 NOTE — Nursing Note (Addendum)
Kelly House here today for ed f/u, states she has had lower/ mid abdominal pain, [facility] for the past few months, nothing can be found.  states w/ these pains, she has diarrhea/ vomiting, she had tried not eating eggs, diary an gluten.   wants GI referral

## 2020-09-14 LAB — CELIAC SCREENING PROFILE, IGA WITH REFLEX TO IGG, SERUM
GLIADIN (DEAMIDATED) ANTIBODY IGA QUALITATIVE: NEGATIVE
GLIADIN (DEAMIDATED) ANTIBODY IGA QUANTITATIVE: <0.2 U/mL (ref <15.0)
TISSUE TRANSGLUTAMINASE ANTIBODIES IGA QUALITATIVE: NEGATIVE
TISSUE TRANSGLUTAMINASE ANTIBODIES IGA QUANTITATIVE: <0.5 U/mL (ref <15.0)

## 2020-09-20 ENCOUNTER — Emergency Department
Admission: EM | Admit: 2020-09-20 | Discharge: 2020-09-20 | Disposition: A | Discharge status: Home / Self Care | Payer: MEDICAID | Attending: Emergency Medicine | Admitting: Emergency Medicine

## 2020-09-20 ENCOUNTER — Other Ambulatory Visit: Payer: Self-pay

## 2020-09-20 ENCOUNTER — Emergency Department (HOSPITAL_COMMUNITY): Payer: MEDICAID

## 2020-09-20 DIAGNOSIS — R109 Unspecified abdominal pain: Secondary | ICD-10-CM

## 2020-09-20 DIAGNOSIS — R1084 Generalized abdominal pain: Secondary | ICD-10-CM | POA: Insufficient documentation

## 2020-09-20 DIAGNOSIS — Z87442 Personal history of urinary calculi: Secondary | ICD-10-CM | POA: Insufficient documentation

## 2020-09-20 DIAGNOSIS — Z885 Allergy status to narcotic agent status: Secondary | ICD-10-CM

## 2020-09-20 DIAGNOSIS — Z87891 Personal history of nicotine dependence: Secondary | ICD-10-CM | POA: Insufficient documentation

## 2020-09-20 DIAGNOSIS — Z880 Allergy status to penicillin: Secondary | ICD-10-CM

## 2020-09-20 DIAGNOSIS — Z6823 Body mass index (BMI) 23.0-23.9, adult: Secondary | ICD-10-CM

## 2020-09-20 LAB — CBC WITH DIFF
BASOPHIL #: 0 10*3/uL (ref 0.00–0.20)
BASOPHIL %: 1 % (ref 0–2)
EOSINOPHIL #: 0 10*3/uL (ref 0.00–0.60)
EOSINOPHIL %: 1 % (ref 0–5)
HCT: 40.3 % (ref 36.0–48.0)
HGB: 13.9 g/dL (ref 11.6–14.8)
LYMPHOCYTE #: 1.4 10*3/uL (ref 1.10–3.80)
LYMPHOCYTE %: 20 % (ref 19–46)
MCH: 30.5 pg (ref 24.4–34.0)
MCHC: 34.5 g/dL (ref 30.0–37.0)
MCV: 88.5 fL — ABNORMAL HIGH (ref 79.0–88.0)
MONOCYTE #: 0.4 10*3/uL (ref 0.10–0.80)
MONOCYTE %: 5 % (ref 4–12)
MPV: 8.8 fL (ref 7.5–11.5)
NEUTROPHIL #: 5 10*3/uL (ref 1.80–7.50)
NEUTROPHIL %: 74 % — ABNORMAL HIGH (ref 41–69)
PLATELETS: 290 10*3/uL (ref 130–400)
RBC: 4.56 10*6/uL (ref 3.50–5.50)
RDW: 13.2 % (ref 11.5–14.0)
WBC: 6.9 10*3/uL (ref 4.5–11.5)

## 2020-09-20 LAB — COMPREHENSIVE METABOLIC PANEL, NON-FASTING
ALBUMIN/GLOBULIN RATIO: 1.7 (ref 1.5–2.5)
ALBUMIN: 4.3 g/dL (ref 3.5–5.0)
ALKALINE PHOSPHATASE: 83 U/L (ref 38–126)
ALT (SGPT): 15 U/L (ref <35)
ANION GAP: 8 mmol/L (ref 5–19)
AST (SGOT): 20 U/L (ref 14–36)
BILIRUBIN TOTAL: 0.3 mg/dL (ref 0.2–1.3)
BUN/CREA RATIO: 18 (ref 6–20)
BUN: 8 mg/dL (ref 7–17)
CALCIUM: 9.3 mg/dL (ref 8.4–10.2)
CHLORIDE: 109 mmol/L — ABNORMAL HIGH (ref 98–107)
CO2 TOTAL: 25 mmol/L (ref 22–30)
CREATININE: 0.45 mg/dL — ABNORMAL LOW (ref 0.52–1.00)
ESTIMATED GFR: >60 mL/min/{1.73_m2} (ref >60)
GLUCOSE: 90 mg/dL (ref 74–106)
POTASSIUM: 4 mmol/L (ref 3.5–5.1)
PROTEIN TOTAL: 6.9 g/dL (ref 6.3–8.2)
SODIUM: 142 mmol/L (ref 137–145)

## 2020-09-20 LAB — HCG, URINE QUALITATIVE, PREGNANCY: HCG URINE QUALITATIVE: NEGATIVE

## 2020-09-20 LAB — URINALYSIS, MACRO/MICRO
BILIRUBIN: NEGATIVE mg/dL
BLOOD: NEGATIVE mg/dL
GLUCOSE: NEGATIVE mg/dL
KETONES: NEGATIVE mg/dL
NITRITE: NEGATIVE
PH: 6.5 (ref 5.0–9.0)
PROTEIN: 10 mg/dL
SPECIFIC GRAVITY: 1.023 (ref 1.003–1.035)
UROBILINOGEN: <2 mg/dL (ref <2.0)

## 2020-09-20 LAB — MICRO HOLD

## 2020-09-20 LAB — LIPASE: LIPASE: 146 U/L (ref 23–300)

## 2020-09-20 LAB — LIGHT GREEN TOP TUBE

## 2020-09-20 MED ORDER — LIDOCAINE HCL 2 % MUCOSAL SOLUTION
15.0000 mL | Status: AC
Start: 2020-09-20 — End: 2020-09-20
  Administered 2020-09-20: 15 mL via ORAL
  Filled 2020-09-20: qty 15

## 2020-09-20 MED ORDER — FAMOTIDINE 20 MG TABLET
ORAL_TABLET | ORAL | Status: AC
Start: 2020-09-20 — End: 2020-09-20
  Filled 2020-09-20: qty 2

## 2020-09-20 MED ORDER — ALUMINUM-MAG HYDROXIDE-SIMETHICONE 200 MG-200 MG-20 MG/5 ML ORAL SUSP
30.0000 mL | ORAL | Status: AC
Start: 2020-09-20 — End: 2020-09-20
  Administered 2020-09-20 (×2): 30 mL via ORAL
  Filled 2020-09-20: qty 30

## 2020-09-20 MED ORDER — FAMOTIDINE 20 MG TABLET
20.0000 mg | ORAL_TABLET | Freq: Two times a day (BID) | ORAL | 0 refills | Status: DC
Start: 2020-09-20 — End: 2020-10-31

## 2020-09-20 MED ORDER — FAMOTIDINE 20 MG TABLET
40.0000 mg | ORAL_TABLET | ORAL | Status: AC
Start: 2020-09-20 — End: 2020-09-20
  Administered 2020-09-20: 40 mg via ORAL

## 2020-09-20 MED ORDER — ONDANSETRON HCL (PF) 4 MG/2 ML INJECTION SOLUTION
INTRAMUSCULAR | Status: AC
Start: 2020-09-20 — End: 2020-09-20
  Filled 2020-09-20: qty 2

## 2020-09-20 MED ORDER — ONDANSETRON HCL (PF) 4 MG/2 ML INJECTION SOLUTION
4.0000 mg | Freq: Once | INTRAMUSCULAR | Status: AC
Start: 2020-09-20 — End: 2020-09-20
  Administered 2020-09-20: 4 mg via INTRAVENOUS

## 2020-09-20 MED ORDER — SODIUM CHLORIDE 0.9 % IV BOLUS
1000.0000 mL | INJECTION | Freq: Once | Status: AC
Start: 2020-09-20 — End: 2020-09-20
  Administered 2020-09-20: 1000 mL via INTRAVENOUS
  Administered 2020-09-20: 0 mL via INTRAVENOUS

## 2020-09-20 NOTE — ED Provider Notes (Signed)
[facility]  ED Primary Provider Note  History of Present Illness   Chief Complaint   Patient presents with   . Abdominal Pain   . Nausea       Kelly House is a 20 y.o. female presenting with left abdominal pain and left flank pain. Pt reports this morning the pain started along with nausea. Pt relates that she has been having issues with abdominal pain for months and has history of kidney stones. In the past months with abdominal pain, she has experienced vomiting and diarrhea, however today she just experienced the pain with nausea. Pt is due to start her period in one week.     ROS  Denies chest pain and shortness of breath, all ROS reviewed, all negative other than stated in the HPI.      Historical Data   Below pertinent information reviewed with patient and/or EMR:  Past Medical History:   Diagnosis Date   . Anxiety    . Constipation    . Depression    . Kidney stone    . Recurrent UTI      Previous Medications    ALBUTEROL SULFATE (PROVENTIL OR VENTOLIN OR PROAIR) 90 MCG/ACTUATION INHALATION HFA AEROSOL INHALER    Take 1-2 Puffs by inhalation Every 6 hours as needed    CETIRIZINE (ZYRTEC) 10 MG ORAL TABLET    Take 10 mg by mouth Once per day as needed    CRANBERRY 500 MG ORAL CAPSULE    Take by mouth    DOCOSAHEXAENOIC ACID/EPA (FISH OIL ORAL)    Take by mouth    ELURYNG 0.12-0.015 MG/24 HR VAGINAL RING        FLUDROCORTISONE (FLORINEF) 0.1 MG ORAL TABLET    Take 1 Tablet (0.1 mg total) by mouth Every morning with breakfast    FLUTICASONE PROPIONATE (FLONASE) 50 MCG/ACTUATION NASAL SPRAY, SUSPENSION    1 spray(s) (nasal) 2 times per day in each nostril for allergy symptoms    LORATADINE-PSEUDOEPHEDRINE (CLARITIN-D) 5-120 MG ORAL TABLET SUSTAINED RELEASE 12 HR    Take 1 Tablet by mouth Twice daily    ZINC ORAL    Take by mouth     Allergies   Allergen Reactions   . Morphine    . Penicillins      Fine rash and itching   . Hydrocodone-Acetaminophen Nausea/ Vomiting     Past Surgical  History:   Procedure Laterality Date   . HX ADENOIDECTOMY     . HX TONSILLECTOMY     . HX WISDOM TEETH EXTRACTION       Family Medical History:     Problem Relation (Age of Onset)    Anxiety Mother    Asthma Brother    Breast Cancer Paternal 74, Paternal Grandmother, Paternal Aunt    Cervical Cancer Sister, Sister    Depression Mother    Heart Disease Mother    Melanoma Maternal Grandmother    Migraines Sister    Prostate Cancer Paternal Grandfather        Social History     Tobacco Use   . Smoking status: Never Smoker   . Smokeless tobacco: Never Used   Vaping Use   . Vaping Use: Former   Substance Use Topics   . Alcohol use: Yes     Comment: 4   . Drug use: Not Currently     Types: Marijuana       Physical Exam   ED Triage Vitals [09/20/20 1022]  BP (Non-Invasive) 119/80   Heart Rate (!) 101   Respiratory Rate 20   Temperature 36.8 C (98.3 F)   SpO2 99 %   Weight 54.4 kg (120 lb)   Height 1.524 m (5')     Nursing notes and vital signs reviewed.    Constitutional:  No acute distress.  Alert and oriented to person, place, and time.   HENT:   Head: Normocephalic and atraumatic.   Mouth/Throat: Oropharynx is clear and moist.   Eyes: EOMI, PERRL   Neck: Trachea midline. Neck supple.  Cardiovascular: RRR, No murmurs, rubs or gallops. Intact distal pulses.  Pulmonary/Chest: BS equal bilaterally. No respiratory distress. No wheezes, rales or chest tenderness.   Abdominal: Pt has left upper quadrant tenderness to palpation   Back: No midline spinal tenderness, no paraspinal tenderness, no CVA tenderness.           Musculoskeletal: No edema, tenderness or deformity.  Skin: warm and dry. No rash, erythema, pallor or cyanosis  Psychiatric: normal mood and affect. Behavior is normal.   Neurological: Patient keenly alert and responsive, CN II-XII grossly intact, moving all extremities equally and fully, normal gait    Patient Data   Labs:   Labs Reviewed   COMPREHENSIVE METABOLIC PANEL, NON-FASTING - Abnormal; Notable for  the following components:       Result Value    CHLORIDE 109 (*)     CREATININE 0.45 (*)     All other components within normal limits    Narrative:     Estimated Glomerular Filtration Rate (eGFR) is calculated using the CKD-EPI (2021) equation, intended for patients 4 years of age and older. If gender is not documented or "unknown", there will be no eGFR calculation.   CBC WITH DIFF - Abnormal; Notable for the following components:    MCV 88.5 (*)     NEUTROPHIL % 74 (*)     All other components within normal limits   URINALYSIS, MACRO/MICRO - Abnormal; Notable for the following components:    LEUKOCYTES Trace (*)     All other components within normal limits   LIPASE - Normal   URINE CULTURE,ROUTINE   CBC/DIFF    Narrative:     The following orders were created for panel order CBC/DIFF.  Procedure                               Abnormality         Status                     ---------                               -----------         ------                     CBC WITH NBVA[[id number]]                Abnormal            Final result                 Please view results for these tests on the individual orders.   URINALYSIS WITH REFLEX MICROSCOPIC AND CULTURE IF POSITIVE    Narrative:     The following orders were created for panel order URINALYSIS WITH  REFLEX MICROSCOPIC AND CULTURE IF POSITIVE.  Procedure                               Abnormality         Status                     ---------                               -----------         ------                     URINALYSIS, HQPRF/FMBWG[[id number]]      Abnormal            Final result                 Please view results for these tests on the individual orders.   HCG, URINE QUALITATIVE, PREGNANCY   MICRO HOLD   EXTRA TUBES    Narrative:     The following orders were created for panel order EXTRA TUBES.  Procedure                               Abnormality         Status                     ---------                               -----------         ------                      LIGHT GREEN TOP VXBL[[id number]]                             In process                   Please view results for these tests on the individual orders.   LIGHT GREEN TOP TUBE     Imaging:  CT ABDOMEN PELVIS WO IV CONTRAST   Final Result by Edi, Radresults In (07/05 1144)   NO ACUTE FINDINGS AT THE ABDOMEN OR PELVIS ON NONCONTRAST CT.          One or more dose reduction techniques were used (e.g., Automated exposure control, adjustment of the mA and/or kV according to patient size, use of iterative reconstruction technique).         Radiologist location ID: [place]   MDM/Course:  Pt work up is negative. Pt is feeling better, although might have a component of gastritis. She was prescribed Pepcid and agreed to follow with PCP this week.       Medications given:  Medications Administered in the ED   famotidine (PEPCID) tablet (has no administration in time range)   aluminum-magnesium hydroxide-simethicone (MAG-AL PLUS) 200-200-20 mg per 5 mL oral liquid (has no administration in time range)     And   lidocaine (XYLOCAINE) 2% oral topical viscous solution (has no administration in time range)   NS bolus  infusion 1,000 mL (1,000 mL Intravenous New Bag/New Syringe 09/20/20 1103)   ondansetron (ZOFRAN) 2 mg/mL injection (4 mg Intravenous Given 09/20/20 1103)       Encounter Diagnosis   Name Primary?   . Abdominal pain, unspecified abdominal location Yes       Disposition: discharged        I am scribing for, and in the presence of, Dr. Darrall Dears for services provided on 09/20/2020.  Conner Assif, SCRIBE     Conner Assif, SCRIBE  09/20/2020, 10:48    I agree with the scribes documentation.  I was present for all parts of the patient's ED course.  [place], DO  09/20/2020, 14:49        Parts of this patients chart were completed in a retrospective fashion due to simultaneous direct patient care activities in the Emergency Department.   This note was partially generated using  MModal Fluency Direct system, and there may be some incorrect words, spellings, and punctuation that were not noted in checking the note before saving.

## 2020-09-20 NOTE — ED Triage Notes (Signed)
PATIENT PRESENTS TO [facility] ER FOR EVALUATION OF GENERALIZED ABD PAIN, LOWER BACK PAIN AND NAUSEA X 1-2 HOURS.

## 2020-09-22 LAB — URINE CULTURE,ROUTINE: URINE CULTURE: <1000

## 2020-10-17 ENCOUNTER — Other Ambulatory Visit (HOSPITAL_BASED_OUTPATIENT_CLINIC_OR_DEPARTMENT_OTHER): Payer: Self-pay | Admitting: Family Medicine

## 2020-10-31 ENCOUNTER — Encounter (INDEPENDENT_AMBULATORY_CARE_PROVIDER_SITE_OTHER): Payer: Self-pay | Admitting: INTERNAL MEDICINE

## 2020-10-31 ENCOUNTER — Ambulatory Visit (HOSPITAL_COMMUNITY): Payer: MEDICAID

## 2020-10-31 ENCOUNTER — Other Ambulatory Visit: Payer: Self-pay

## 2020-10-31 ENCOUNTER — Ambulatory Visit: Payer: MEDICAID | Attending: INTERNAL MEDICINE | Admitting: INTERNAL MEDICINE

## 2020-10-31 ENCOUNTER — Telehealth (INDEPENDENT_AMBULATORY_CARE_PROVIDER_SITE_OTHER): Payer: Self-pay | Admitting: INTERNAL MEDICINE

## 2020-10-31 VITALS — BP 122/74 | HR 74 | Ht 61.0 in | Wt 121.0 lb

## 2020-10-31 DIAGNOSIS — Z6822 Body mass index (BMI) 22.0-22.9, adult: Secondary | ICD-10-CM

## 2020-10-31 DIAGNOSIS — R109 Unspecified abdominal pain: Secondary | ICD-10-CM

## 2020-10-31 DIAGNOSIS — R1013 Epigastric pain: Secondary | ICD-10-CM | POA: Insufficient documentation

## 2020-10-31 LAB — IMMUNOGLOBULIN A (IGA), SERUM: IMMUNOGLOBULIN A (IGA): 126 mg/dL (ref 70–400)

## 2020-10-31 NOTE — Progress Notes (Signed)
GASTROENTEROLOGY, [facility] [address]  [place] [address]  Operated by [facility]     Name: Kelly House MRN:  [id number]   Date: 10/31/2020 Age: 20 y.o.       Chief Complaint: Abdominal Pain and New Patient (Pt reports x several months she has had a bad episodes of abd cramps, N/V and diarrhea, each episode can last up to a couple days)       History of present illness:   Patient is 20 year old female with epigastric and mid abdominal pain.  Her pain occur a week before her menstrualperiode.  And resolved after .Marland Kitchen  She may have couple days of loose stool before her menstrual periode.  No nausea no vomiting.  She has heartburn off that she use Tums for.  She denies using NSAIDs.  CT scan of the abdomen and pelvis in July was unremarkable.    Past Medical, Surgical, Family and Social History:  Current Outpatient Medications   Medication Sig   . albuterol sulfate (PROVENTIL OR VENTOLIN OR PROAIR) 90 mcg/actuation Inhalation HFA Aerosol Inhaler Take 1-2 Puffs by inhalation Every 6 hours as needed   . cetirizine (ZYRTEC) 10 mg Oral Tablet Take 10 mg by mouth Once per day as needed   . Cranberry 500 mg Oral Capsule Take by mouth   . docosahexaenoic acid/epa (FISH OIL ORAL) Take by mouth   . ELURYNG 0.12-0.015 mg/24 hr Vaginal Ring    . fludrocortisone (FLORINEF) 0.1 mg Oral Tablet Take 1 Tablet (0.1 mg total) by mouth Every morning with breakfast   . fluticasone propionate (FLONASE) 50 mcg/actuation Nasal Spray, Suspension USE 1 SPRAY IN EACH NOSTRIL TWICE DAILY FOR ALLERGY SYMPTOMS   . loratadine-pseudoephedrine (CLARITIN-D) 5-120 mg Oral Tablet Sustained Release 12 hr Take 1 Tablet by mouth Twice daily     Allergies   Allergen Reactions   . Morphine    . Penicillins      Fine rash and itching   . Hydrocodone-Acetaminophen Nausea/ Vomiting     Past Medical History:   Diagnosis Date   . Anxiety    . Constipation    . Depression    . Kidney stone    . Recurrent UTI          Past Surgical  History:   Procedure Laterality Date   . HX ADENOIDECTOMY     . HX TONSILLECTOMY     . HX WISDOM TEETH EXTRACTION           Family Medical History:     Problem Relation (Age of Onset)    Anxiety Mother    Asthma Brother    Breast Cancer Paternal 28, Paternal Grandmother, Paternal Aunt    Cervical Cancer Sister, Sister    Depression Mother    Heart Disease Mother    Melanoma Maternal Grandmother    Migraines Sister    Prostate Cancer Paternal Grandfather          Social History     Socioeconomic History   . Marital status: Single   Tobacco Use   . Smoking status: Never Smoker   . Smokeless tobacco: Never Used   Vaping Use   . Vaping Use: Never used   Substance and Sexual Activity   . Alcohol use: Yes     Comment: couple drinks 3 x per week   . Drug use: Not Currently     Types: Marijuana     Comment: smoked in the past   .  Sexual activity: Yes     Partners: Male     Birth control/protection: Condom, Pill        ROS: Review of all systems in detail were negative except what I mentioned in my history of present illness and PMH.     Objective:    BP 122/74   Pulse 74   Ht 1.549 m ('5\' 1"'$ )   Wt 54.9 kg (121 lb)   BMI 22.86 kg/m           General:  Patient appears stated age, pleasant cooperative with exam, no acute distress  HEENT:  Conjunctiva anicteric.   Cardiovascular:  Heart regular rhythm rate, no cyanosis or clubbing, no rub, no edema  Pulmonary:  Lungs are clear to auscultation in all lungs fields.    Gastrointestinal:  Abdomen is soft, no tenderness, no rebound tenderness, no rigidity, bowel sounds present, no bruit, no organomegaly.  Skin:  Normal color, no tenderness, no jaundice  Neurological exam:  Alert oriented x 3, muscle strength is intact bilaterally, sensation light touch intact bilaterally  Psychiatric:  mood and affect appropriate.      Labs:   COMPLETE BLOOD COUNT   Lab Results   Component Value Date    WBC 6.9 09/20/2020    HGB 13.9 09/20/2020    HCT 40.3 09/20/2020    PLTCNT 290 09/20/2020        DIFFERENTIAL  Lab Results   Component Value Date    PMNS 74 (H) 09/20/2020    LYMPHOCYTES 20 09/20/2020    MONOCYTES 5 09/20/2020    EOSINOPHIL 1 09/20/2020    BASOPHILS 1 09/20/2020    BASOPHILS 0.00 09/20/2020    NRBCS 0 11/08/2009    PMNABS 5.00 09/20/2020    LYMPHSABS 1.40 09/20/2020    EOSABS 0.00 09/20/2020    MONOSABS 0.40 09/20/2020    BASOSABS 0.010 11/08/2009     COMPREHENSIVE METABOLIC PANEL - NON FASTING  Lab Results   Component Value Date    SODIUM 142 09/20/2020    POTASSIUM 4.0 09/20/2020    CHLORIDE 109 (H) 09/20/2020    CO2 25 09/20/2020    ANIONGAP 8 09/20/2020    BUN 8 09/20/2020    CREATININE 0.45 (L) 09/20/2020    GLUCOSENF 90 09/20/2020    CALCIUM 9.3 09/20/2020    PHOSPHORUS 4.1 11/08/2009    ALBUMIN 4.3 09/20/2020    TOTALPROTEIN 6.9 09/20/2020    ALKPHOS 83 09/20/2020    AST 20 09/20/2020    ALT 15 09/20/2020    BILIRUBINCON 0.2 08/22/2020     LIVER TESTS  Lab Results   Component Value Date    ALBUMIN 4.3 09/20/2020    AST 20 09/20/2020    ALT 15 09/20/2020    ALKPHOS 83 09/20/2020    TOTBILIRUBIN 0.3 09/20/2020    BILIRUBINCON 0.2 08/22/2020       Radiology:  Recent Results (from the past [id number] hour(s))   CT ABDOMEN PELVIS WO IV CONTRAST    Collection Time: 09/20/20 11:37 AM    Narrative    [name] Garoutte    RADIOLOGIST: Danne Baxter, MD    CT ABDOMEN PELVIS WO IV CONTRAST performed on 09/20/2020 11:37 AM    CLINICAL HISTORY: abd pain.  Pain is generalized. Nausea. Symptoms have been ongoing x 5 months. Previous: Hx of kidney stones.    TECHNIQUE:  Abdomen and pelvis CT without intravenous contrast.    COMPARISON:  08/22/2020  # of known CTs in the  past 12 months: 1   # of known Cardiac Nuclear Medicine Studies in the past 12 months: 0    FINDINGS:  Noncontrast technique limits evaluation of the abdominal and pelvic viscera.    Lung bases: Clear    Liver:   Unremarkable.    Gallbladder:   Unremarkable.    Spleen:   Unremarkable.    Pancreas:   Unremarkable.    Adrenals:    Unremarkable.    Kidneys:   Unremarkable.    Bladder:  Unremarkable.    Uterus and Adnexa:  Unremarkable.    Bowel:   Unremarkable.    Appendix:  Normal.    Lymph nodes:  No suspicious lymph node enlargement.    Vasculature:   Major vascular structures are unremarkable.     Peritoneum / Retroperitoneum: Small volume free fluid in the pelvis, nonspecific and usually physiologic in a female patient of this age.    Bones:   Unremarkable.          Impression    NO ACUTE FINDINGS AT THE ABDOMEN OR PELVIS ON NONCONTRAST CT.       One or more dose reduction techniques were used (e.g., Automated exposure control, adjustment of the mA and/or kV according to patient size, use of iterative reconstruction technique).      Radiologist location ID: [id number]     CT ABDOMEN PELVIS W IV CONTRAST    Collection Time: 08/22/20  2:43 PM    Narrative    [name] [place]    RADIOLOGIST: Dorisann Frames, MD    CT ABDOMEN PELVIS W IV CONTRAST performed on 08/22/2020 2:43 PM    CLINICAL HISTORY: LUQ abdominal pain.  Pt c/o generalized abd pain with N/V x 1 day.     TECHNIQUE:  Abdomen and pelvis CT with intravenous contrast.  IV CONTRAST: 75 ml's of Isovue 300    COMPARISON:  04/12/2011  # of known CTs in the past 12 months: 0   # of known Cardiac Nuclear Medicine Studies in the past 12 months: 0    FINDINGS:  Lung bases: Clear    Liver:   Unremarkable.    Gallbladder:   Unremarkable.    Spleen:   Normal size. Homogeneous enhancement.    Pancreas:   Unremarkable.    Adrenals:   Unremarkable.    Kidneys:   Unremarkable.    Bladder:  Unremarkable.    Uterus and Adnexa:  Unremarkable. Ring shaped radiolucent intravaginal device.    Bowel:   No obstruction. No diverticulitis.  Appendix:  Normal.    Lymph nodes:  No suspicious lymph node enlargement.  Vasculature:   Major vascular structures are unremarkable.   Peritoneum / Retroperitoneum: Small volume free fluid in the pelvis, nonspecific and usually physiologic in a female patient of this  age.    Bones:   Unremarkable.        Impression    1.MINIMAL FREE FLUID IN THE PELVIS, LIKELY PHYSIOLOGIC.  2.OTHERWISE UNREMARKABLE ABDOMEN AND PELVIS CT.       One or more dose reduction techniques were used (e.g., Automated exposure control, adjustment of the mA and/or kV according to patient size, use of iterative reconstruction technique).      Radiologist location ID: [id number]       Pathology:  No results found for this or any previous visit (from the past 720 hour(s)).     Counseling (Quality Measures):  Colonoscopy    Body mass index is 22.86 kg/m.  Patient was  counseled on the benefits of weight loss and target weight discussed.      Assessment and Plan:   (R10.13) Epigastric pain  (primary encounter diagnosis)  Plan: IMMUNOGLOBULIN A (IGA), SERUM, TISSUE         TRANSGLUTAMINASE (TTG) IGA & IGG PROFILE,         ENDOMYSIAL ANTIBODY SCREEN (IGA), REFLEX TO         TITER, DEAMIDATED GLIADIN PEPTIDE (DGP) IGA &         IGG PROFILE    (R10.9) Abdominal pain, unspecified abdominal location  Plan: IMMUNOGLOBULIN A (IGA), SERUM, TISSUE         TRANSGLUTAMINASE (TTG) IGA & IGG PROFILE,         ENDOMYSIAL ANTIBODY SCREEN (IGA), REFLEX TO         TITER, DEAMIDATED GLIADIN PEPTIDE (DGP) IGA &         IGG PROFILE       1. Epigastric pain of unclear etiology.  Will proceed EGD to rule out any possibility of gastritis with without H.pylori bacteria or peptic ulcer disease or acid reflux.  If this is negative, her symptoms likely due to functional abdominal pain before her she menstrual periode.  Will check celiac serology to rule out celiac disease.  We may consider a course of PPI after EGD.    Return to Clinic:   Return if symptoms worsen or fail to improve.     Burgess Amor, MD     Disclaimer:  This note was partially created using voice recognition software and is inherently subject to errors including those of syntax and "sound alike" substitutions which may escape proof reading.  In such instances, original  meaning may be extrapolated by and contextual derivation.  If typographical errors noted and/or phrasing does not make sense, please contact me for corrections.  I reserve the right to change note to correct mistakes secondary to MModal misinterpretation.

## 2020-10-31 NOTE — Telephone Encounter (Signed)
IN OFFICE, pt scheduled for EGD for abd pain with Dr. Jarold Song on 11/17/2020 @ 11am, instructions given to pt, HP primary NAR, BMI 22.8

## 2020-11-02 LAB — TISSUE TRANSGLUTAMINASE (TTG) IGA & IGG PROFILE
TISSUE TRANSGLUTAMINASE ANTIBODIES IGA QUALITATIVE: NEGATIVE
TISSUE TRANSGLUTAMINASE ANTIBODIES IGA QUANTITATIVE: <0.5 U/mL (ref <15.0)
TISSUE TRANSGLUTAMINASE ANTIBODIES IGG QUALITATIVE: NEGATIVE
TISSUE TRANSGLUTAMINASE ANTIBODIES IGG QUANTITATIVE: <0.8 U/mL (ref <15.0)

## 2020-11-02 LAB — DEAMIDATED GLIADIN PEPTIDE (DGP) IGA & IGG PROFILE
GLIADIN (DEAMIDATED) ANTIBODY IGA QUALITATIVE: NEGATIVE
GLIADIN (DEAMIDATED) ANTIBODY IGA QUANTITATIVE: <0.2 U/mL (ref <15.0)
GLIADIN (DEAMIDATED) ANTIBODY IGG QUALITATIVE: NEGATIVE
GLIADIN (DEAMIDATED) ANTIBODY IGG QUANTITATIVE: <0.4 U/mL (ref <15.0)

## 2020-11-06 LAB — ENDOMYSIAL ANTIBODY SCREEN (IGA), REFLEX TO TITER: ENDOMYSIAL AB (IGA) SCREEN: NEGATIVE

## 2020-11-08 ENCOUNTER — Encounter (INDEPENDENT_AMBULATORY_CARE_PROVIDER_SITE_OTHER): Payer: Self-pay | Admitting: NURSE PRACTITIONER

## 2020-11-15 ENCOUNTER — Encounter (HOSPITAL_COMMUNITY): Payer: Self-pay

## 2020-11-15 ENCOUNTER — Emergency Department (HOSPITAL_COMMUNITY)
Admission: RE | Admit: 2020-11-15 | Discharge: 2020-11-15 | Disposition: A | Discharge status: Home / Self Care | Payer: MEDICAID | Source: Ambulatory Visit

## 2020-11-15 ENCOUNTER — Other Ambulatory Visit: Payer: Self-pay

## 2020-11-15 ENCOUNTER — Emergency Department
Admission: EM | Admit: 2020-11-15 | Discharge: 2020-11-15 | Disposition: A | Discharge status: Home / Self Care | Payer: MEDICAID | Attending: Emergency Medicine | Admitting: Emergency Medicine

## 2020-11-15 DIAGNOSIS — R1010 Upper abdominal pain, unspecified: Secondary | ICD-10-CM | POA: Insufficient documentation

## 2020-11-15 DIAGNOSIS — Z6823 Body mass index (BMI) 23.0-23.9, adult: Secondary | ICD-10-CM

## 2020-11-15 DIAGNOSIS — R109 Unspecified abdominal pain: Secondary | ICD-10-CM

## 2020-11-15 LAB — COMPREHENSIVE METABOLIC PANEL, NON-FASTING
ALBUMIN/GLOBULIN RATIO: 1.6 (ref 1.5–2.5)
ALBUMIN: 4.5 g/dL (ref 3.5–5.0)
ALKALINE PHOSPHATASE: 76 U/L (ref 38–126)
ALT (SGPT): 13 U/L (ref <35)
ANION GAP: 8 mmol/L (ref 5–19)
AST (SGOT): 20 U/L (ref 14–36)
BILIRUBIN TOTAL: 0.3 mg/dL (ref 0.2–1.3)
BUN/CREA RATIO: 13 (ref 6–20)
BUN: 7 mg/dL (ref 7–17)
CALCIUM: 10 mg/dL (ref 8.4–10.2)
CHLORIDE: 106 mmol/L (ref 98–107)
CO2 TOTAL: 27 mmol/L (ref 22–30)
CREATININE: 0.55 mg/dL (ref 0.52–1.00)
ESTIMATED GFR: >60 mL/min/{1.73_m2} (ref >60)
GLUCOSE: 90 mg/dL (ref 74–106)
POTASSIUM: 3.7 mmol/L (ref 3.5–5.1)
PROTEIN TOTAL: 7.3 g/dL (ref 6.3–8.2)
SODIUM: 141 mmol/L (ref 137–145)

## 2020-11-15 LAB — CBC WITH DIFF
BASOPHIL #: 0 10*3/uL (ref 0.00–0.20)
BASOPHIL %: 1 % (ref 0–2)
EOSINOPHIL #: 0.1 10*3/uL (ref 0.00–0.60)
EOSINOPHIL %: 1 % (ref 0–5)
HCT: 39.2 % (ref 36.0–48.0)
HGB: 13.5 g/dL (ref 11.6–14.8)
LYMPHOCYTE #: 2.2 10*3/uL (ref 1.10–3.80)
LYMPHOCYTE %: 38 % (ref 19–46)
MCH: 30.3 pg (ref 24.4–34.0)
MCHC: 34.5 g/dL (ref 30.0–37.0)
MCV: 87.8 fL (ref 79.0–88.0)
MONOCYTE #: 0.3 10*3/uL (ref 0.10–0.80)
MONOCYTE %: 5 % (ref 4–12)
MPV: 8.4 fL (ref 7.5–11.5)
NEUTROPHIL #: 3.2 10*3/uL (ref 1.80–7.50)
NEUTROPHIL %: 55 % (ref 41–69)
PLATELETS: 319 10*3/uL (ref 130–400)
RBC: 4.46 10*6/uL (ref 3.50–5.50)
RDW: 13 % (ref 11.5–14.0)
WBC: 5.9 10*3/uL (ref 4.5–11.5)

## 2020-11-15 LAB — URINALYSIS, MACRO/MICRO
BILIRUBIN: NEGATIVE mg/dL
BLOOD: NEGATIVE mg/dL
GLUCOSE: NEGATIVE mg/dL
KETONES: NEGATIVE mg/dL
NITRITE: NEGATIVE
PH: 6.5 (ref 5.0–9.0)
PROTEIN: NEGATIVE mg/dL
SPECIFIC GRAVITY: 1.011 (ref 1.003–1.035)
UROBILINOGEN: <2 mg/dL (ref <2.0)

## 2020-11-15 LAB — LIPASE: LIPASE: 176 U/L (ref 23–300)

## 2020-11-15 LAB — HCG QUALITATIVE PREGNANCY, SERUM: PREGNANCY, SERUM QUALITATIVE: NEGATIVE

## 2020-11-15 LAB — URINE HOLD

## 2020-11-15 LAB — MICRO HOLD

## 2020-11-15 MED ORDER — DICYCLOMINE 20 MG TABLET
20.0000 mg | ORAL_TABLET | Freq: Four times a day (QID) | ORAL | 0 refills | Status: DC | PRN
Start: 2020-11-15 — End: 2020-12-07

## 2020-11-15 MED ORDER — SODIUM CHLORIDE 0.9 % IV BOLUS
1000.0000 mL | INJECTION | Status: AC
Start: 2020-11-15 — End: 2020-11-15
  Administered 2020-11-15: 16:00:00 0 mL via INTRAVENOUS
  Administered 2020-11-15: 1000 mL via INTRAVENOUS
  Administered 2020-11-15: 0 mL via INTRAVENOUS

## 2020-11-15 MED ORDER — KETOROLAC 30 MG/ML (1 ML) INJECTION SOLUTION
30.0000 mg | INTRAMUSCULAR | Status: AC
Start: 2020-11-15 — End: 2020-11-15
  Administered 2020-11-15: 30 mg via INTRAVENOUS

## 2020-11-15 MED ORDER — ONDANSETRON HCL (PF) 4 MG/2 ML INJECTION SOLUTION
INTRAMUSCULAR | Status: AC
Start: 2020-11-15 — End: 2020-11-15
  Filled 2020-11-15: qty 2

## 2020-11-15 MED ORDER — ONDANSETRON HCL (PF) 4 MG/2 ML INJECTION SOLUTION
4.0000 mg | INTRAMUSCULAR | Status: AC
Start: 2020-11-15 — End: 2020-11-15
  Administered 2020-11-15: 4 mg via INTRAVENOUS

## 2020-11-15 MED ORDER — KETOROLAC 30 MG/ML (1 ML) INJECTION SOLUTION
INTRAMUSCULAR | Status: AC
Start: 2020-11-15 — End: 2020-11-15
  Filled 2020-11-15: qty 1

## 2020-11-15 NOTE — ED Triage Notes (Signed)
Having abdominal pain once a month for 7 months. Happened the week before menstrual cycle OB has run multiple tests with negative results per patient. Took stool softener yesterday with only small BM

## 2020-11-15 NOTE — ED Provider Notes (Signed)
[facility]  Emergency Department      Name: Kelly House  Age and Gender: 20 y.o. female  Date of Birth: Nov 27, 2000  Date of Service: 11/15/2020   MRN: [id number]  PCP: Juel Burrow, DO    Chief Complaint   Patient presents with   . Abdominal Pain       HPI:    Kelly House is a 20 y.o. female presenting with upper abdominal pain.  Patient presents with upper abdominal pain that started in the last 48 hours and has progressively got worse.  She does report nausea with no vomiting.  She states she has had symptoms for the last several months at least once a month about a week prior to her menstrual cycle.  She has multiple test and is scheduled for an endoscopy later this week.  She does report that she has not had a great bowel movement in over a week did take some stool softeners with a small bowel movement on Sunday.  No vaginal bleeding or discharge no urinary frequency or urgency.  No previous abdominal surgeries.        ROS:  All systems reviewed and are negative, unless stated in the HPI.      Below pertinent information reviewed with patient and/or EMR:  Past Medical History:   Diagnosis Date   . Anxiety    . Constipation    . Depression    . Kidney stone    . Recurrent UTI      Medications Prior to Admission     Prescriptions    albuterol sulfate (PROVENTIL OR VENTOLIN OR PROAIR) 90 mcg/actuation Inhalation HFA Aerosol Inhaler    Take 1-2 Puffs by inhalation Every 6 hours as needed    cetirizine (ZYRTEC) 10 mg Oral Tablet    Take 10 mg by mouth Once per day as needed    Cranberry 500 mg Oral Capsule    Take by mouth    docosahexaenoic acid/epa (FISH OIL ORAL)    Take by mouth    ELURYNG 0.12-0.015 mg/24 hr Vaginal Ring    fludrocortisone (FLORINEF) 0.1 mg Oral Tablet    Take 1 Tablet (0.1 mg total) by mouth Every morning with breakfast    fluticasone propionate (FLONASE) 50 mcg/actuation Nasal Spray, Suspension    USE 1 SPRAY IN EACH NOSTRIL TWICE DAILY FOR ALLERGY SYMPTOMS     loratadine-pseudoephedrine (CLARITIN-D) 5-120 mg Oral Tablet Sustained Release 12 hr    Take 1 Tablet by mouth Twice daily        Allergies   Allergen Reactions   . Morphine    . Penicillins      Fine rash and itching   . Hydrocodone-Acetaminophen Nausea/ Vomiting     Past Surgical History:   Procedure Laterality Date   . HX ADENOIDECTOMY     . HX TONSILLECTOMY     . HX WISDOM TEETH EXTRACTION       Family Medical History:     Problem Relation (Age of Onset)    Anxiety Mother    Asthma Brother    Breast Cancer Paternal 72, Paternal Grandmother, Paternal Aunt    Cervical Cancer Sister, Sister    Depression Mother    Heart Disease Mother    Melanoma Maternal Grandmother    Migraines Sister    Prostate Cancer Paternal Grandfather        Social History     Tobacco Use   . Smoking status: Never Smoker   . Smokeless tobacco:  Never Used   Vaping Use   . Vaping Use: Never used   Substance Use Topics   . Alcohol use: Yes     Comment: couple drinks 3 x per week   . Drug use: Not Currently     Types: Marijuana     Comment: smoked in the past       Objective:  ED Triage Vitals [11/15/20 1326]   BP (Non-Invasive) (!) 142/83   Heart Rate 83   Respiratory Rate 20   Temperature 37.1 C (98.7 F)   SpO2 100 %   Weight 56.7 kg (125 lb)   Height 1.549 m ('5\' 1"'$ )     Nursing notes and vital signs reviewed.    Constitutional:  No acute distress.  Alert and oriented to person, place, and time.   HENT:   Head: Normocephalic and atraumatic.   Mouth/Throat: Oropharynx is clear and moist.   Eyes: EOMI, PERRL   Neck: Trachea midline. Neck supple.  Cardiovascular: RRR, No murmurs, rubs or gallops. Intact distal pulses.  Pulmonary/Chest: BS equal bilaterally. No respiratory distress. No wheezes, rales or chest tenderness.  No tachypnea, retractions or accessory muscle use.  Abdominal:  Patient is tender to palpation left mid upper quadrant.  Bowel sounds are hypoactive.    Back: No midline spinal tenderness, no paraspinal tenderness, no CVA  tenderness.           Musculoskeletal: No edema, tenderness or deformity.  Strength 5/5 in all extremities  Skin: warm and dry. No rash, erythema, pallor or cyanosis  Psychiatric: normal mood and affect. Behavior is normal.   Neurological: Patient keenly alert and responsive, CN II-XII grossly intact, moving all extremities equally and fully    Labs:   Labs Reviewed   URINALYSIS, MACRO/MICRO - Abnormal; Notable for the following components:       Result Value    LEUKOCYTES Trace (*)     All other components within normal limits   COMPREHENSIVE METABOLIC PANEL, NON-FASTING - Normal    Narrative:     Estimated Glomerular Filtration Rate (eGFR) is calculated using the CKD-EPI (2021) equation, intended for patients 92 years of age and older. If gender is not documented or "unknown", there will be no eGFR calculation.   LIPASE - Normal   CBC WITH DIFF - Normal   CBC/DIFF    Narrative:     The following orders were created for panel order CBC/DIFF.  Procedure                               Abnormality         Status                     ---------                               -----------         ------                     CBC WITH FTDD[[id number]]                Normal              Final result                 Please view results for these tests on the individual  orders.   HCG QUALITATIVE PREGNANCY, SERUM   URINALYSIS MACROSCOPIC WITH REFLEX TO MICROSCOPIC URINALYSIS (CULTURE NOT PERFORMED)    Narrative:     The following orders were created for panel order URINALYSIS MACROSCOPIC WITH REFLEX TO MICROSCOPIC URINALYSIS (CULTURE NOT PERFORMED).  Procedure                               Abnormality         Status                     ---------                               -----------         ------                     URINALYSIS, MACRO/MICRO[[id number]]      Abnormal            Final result                 Please view results for these tests on the individual orders.   URINE HOLD   MICRO HOLD       Imaging:  XR ABD FLAT AND  UPRIGHT SERIES (W PA CHEST)   Final Result by Edi, Radresults In (08/30 1459)   NEGATIVE ACUTE ABDOMINAL SERIES.            Radiologist location ID: [id number]             EKG:     MDM/Course:  X-ray and labs are negative.  Patient has had symptoms for over 7 months and they seem always start about a week prior to menstrual cycles.  She has a follow-up with GI later this week for an upper endoscopy.  At this time that no concerning findings and with the chronicity of symptoms I do not feel CT imaging is warranted.  I feel she needs to continue with her GI workup but I also discussed the possibility of endometriosis and that if her symptoms persist she may need to discuss this with her Ob/gyn.  Patient expressed understanding and will be providers.           Clinical Impression:     Encounter Diagnosis   Name Primary?   . Abdominal pain, unspecified abdominal location Yes       Medications given:  Medications Administered in the ED   NS bolus infusion 1,000 mL (1,000 mL Intravenous New Bag/New Syringe 11/15/20 1406)   ondansetron (ZOFRAN) 2 mg/mL injection (4 mg Intravenous Given 11/15/20 1407)   ketorolac (TORADOL) 30 mg/mL injection (30 mg Intravenous Given 11/15/20 1407)           Disposition: Discharged       Current Discharge Medication List      START taking these medications.      Details   dicyclomine 20 mg Tablet  Commonly known as: BENTYL   20 mg, Oral, EVERY 6 HOURS PRN  Qty: 20 Tablet  Refills: 0        CONTINUE these medications - NO CHANGES were made during your visit.      Details   albuterol sulfate 90 mcg/actuation HFA Aerosol Inhaler  Commonly known as: PROVENTIL or VENTOLIN or PROAIR   1-2 Puffs, Inhalation, EVERY 6 HOURS PRN  Qty: 1 Each  Refills: 3     cetirizine 10 mg Tablet  Commonly known as: ZYRTEC   10 mg, Oral, DAILY PRN  Refills: 0     Cranberry 500 mg Capsule   Oral  Refills: 0     EluRyng 0.12-0.015 mg/24 hr Ring  Generic drug: Etonogestrel-Ethinyl Estradiol   No dose, route, or frequency  recorded.  Refills: 0     FISH OIL ORAL   Oral  Refills: 0     fludrocortisone 0.1 mg Tablet  Commonly known as: FLORINEF   0.1 mg, Oral, EVERY MORNING WITH BREAKFAST  Qty: 30 Tablet  Refills: 5     fluticasone propionate 50 mcg/actuation Spray, Suspension  Commonly known as: FLONASE   USE 1 SPRAY IN EACH NOSTRIL TWICE DAILY FOR ALLERGY SYMPTOMS  Qty: 16 mL  Refills: 5     loratadine-pseudoephedrine 5-120 mg Tablet Sustained Release 12 hr  Commonly known as: CLARITIN-D   1 Tablet, Oral, 2 TIMES DAILY  Qty: 60 Tablet  Refills: 0            Follow up:    Juel Burrow, DO  [place] [address]  [phone]    In 1 week        Parts of this patients chart were completed in a retrospective fashion due to simultaneous direct patient care activities in the Emergency Department.   This note was partially generated using MModal Fluency Direct system, and there may be some incorrect words, spellings, and punctuation that were not noted in checking the note before saving.

## 2020-11-15 NOTE — Telephone Encounter (Signed)
Patient's xray did not reveal retained stool. Patient told ED Dr she hasn't had a good BM. She would need a 2 day prep then for Thursday which needed started today. I doubt with her nausea she will tolerate prep. I recommend just EGD first. But if she wants both together then will need rescheduled for 2 day prep.  Elmer Bales, APRN  [id number], 16:10

## 2020-11-15 NOTE — Telephone Encounter (Signed)
-----   Message from [facility] sent at 11/15/2020  3:37 PM EDT -----  Regarding: colon   Patient is on the schedule for Thursday for an EGD.wants  colon added for severe lower abd pain and constipation. She is in the E R  now .

## 2020-11-16 ENCOUNTER — Encounter (INDEPENDENT_AMBULATORY_CARE_PROVIDER_SITE_OTHER): Payer: Self-pay | Admitting: NURSE PRACTITIONER

## 2020-11-16 NOTE — Telephone Encounter (Signed)
Call from pt to R/S procedure to allow her time to prep, adding colon procedure for constipation and lower abd pain, 2 day Miralax prep OTC instructions emailed to pt at [email or web address]

## 2020-11-25 ENCOUNTER — Other Ambulatory Visit (HOSPITAL_BASED_OUTPATIENT_CLINIC_OR_DEPARTMENT_OTHER): Payer: Self-pay | Admitting: Family Medicine

## 2020-11-25 MED ORDER — ETONOGESTREL 0.12 MG-ETHINYL ESTRADIOL 0.015 MG/24 HR VAGINAL RING
1.0000 | VAGINAL_RING | Freq: Once | VAGINAL | 0 refills | Status: DC
Start: 2020-11-25 — End: 2020-12-07

## 2020-11-25 MED ORDER — ELURYNG 0.12 MG-0.015 MG/24 HR VAGINAL RING
1.0000 | VAGINAL_RING | Freq: Once | VAGINAL | 0 refills | Status: DC
Start: 2020-11-25 — End: 2020-11-25

## 2020-12-01 ENCOUNTER — Encounter (INDEPENDENT_AMBULATORY_CARE_PROVIDER_SITE_OTHER): Payer: Self-pay | Admitting: PHYSICIAN ASSISTANT

## 2020-12-06 ENCOUNTER — Telehealth (INDEPENDENT_AMBULATORY_CARE_PROVIDER_SITE_OTHER): Payer: Self-pay | Admitting: INTERNAL MEDICINE

## 2020-12-06 NOTE — Telephone Encounter (Signed)
Canceled colon 11/17/2020, call to reschedule.Polo Riley, RN  12/06/2020, 17:23

## 2020-12-07 ENCOUNTER — Ambulatory Visit: Payer: MEDICAID | Attending: PHYSICIAN ASSISTANT | Admitting: PHYSICIAN ASSISTANT

## 2020-12-07 ENCOUNTER — Other Ambulatory Visit: Payer: Self-pay

## 2020-12-07 ENCOUNTER — Encounter (INDEPENDENT_AMBULATORY_CARE_PROVIDER_SITE_OTHER): Payer: Self-pay | Admitting: PHYSICIAN ASSISTANT

## 2020-12-07 VITALS — BP 108/68 | Ht 61.0 in | Wt 122.0 lb

## 2020-12-07 DIAGNOSIS — Z124 Encounter for screening for malignant neoplasm of cervix: Secondary | ICD-10-CM | POA: Insufficient documentation

## 2020-12-07 DIAGNOSIS — R102 Pelvic and perineal pain: Secondary | ICD-10-CM | POA: Insufficient documentation

## 2020-12-07 DIAGNOSIS — Z113 Encounter for screening for infections with a predominantly sexual mode of transmission: Secondary | ICD-10-CM | POA: Insufficient documentation

## 2020-12-07 DIAGNOSIS — Z3044 Encounter for surveillance of vaginal ring hormonal contraceptive device: Secondary | ICD-10-CM | POA: Insufficient documentation

## 2020-12-07 DIAGNOSIS — Z01419 Encounter for gynecological examination (general) (routine) without abnormal findings: Secondary | ICD-10-CM | POA: Insufficient documentation

## 2020-12-07 DIAGNOSIS — Z6823 Body mass index (BMI) 23.0-23.9, adult: Secondary | ICD-10-CM

## 2020-12-07 DIAGNOSIS — N946 Dysmenorrhea, unspecified: Secondary | ICD-10-CM | POA: Insufficient documentation

## 2020-12-07 LAB — CHLAMYDIA / NEISSERIA DNA BY PCR
CHLAMYDIA TRACHOMATIS PCR: NOT DETECTED
NEISSERIA GONORRHOEAE PCR: NOT DETECTED

## 2020-12-07 MED ORDER — ETONOGESTREL 0.12 MG-ETHINYL ESTRADIOL 0.015 MG/24 HR VAGINAL RING
1.0000 | VAGINAL_RING | Freq: Once | VAGINAL | 11 refills | Status: DC
Start: 2020-12-07 — End: 2021-05-22

## 2020-12-07 NOTE — Progress Notes (Signed)
OB/GYN, [address]  [place] [address]  Operated by [facility]     Name: Kelly House MRN:  [id number]   Date: 12/07/2020 Age: 20 y.o.     Chief Complaint   Patient presents with   . Annual Exam     Pt here for annual pap.  Bad monthly cramps - 1 week before period - vomits/diarrhea from them. Has had scans, u/s, xrays, different testing.  ER last month and was told to get tested for endometriosis.   LMP 11/23/20.  Need Nuvaring refill.        Subjective:     Kelly House is a 20 y.o. female who presents for annual exam. She c/o dealing with very bad pelvic cramping monthly, which starts 1 week before her cycle. She experiences vomiting and diarrhea from the pain. This has been going on for about 8 months. She had a pelvic ultrasound in 07/2020 which was normal. She has also been evaluated by PCP and all testing was normal. They recommended she be evaluated for endometriosis. She will need refills on Nuvaring.       Past Medical History:   Diagnosis Date   . Anxiety    . Constipation    . Depression    . Kidney stone    . Recurrent UTI      Past Surgical History:   Procedure Laterality Date   . HX ADENOIDECTOMY     . HX TONSILLECTOMY     . HX WISDOM TEETH EXTRACTION       Family Medical History:     Problem Relation (Age of Onset)    Anxiety Mother    Asthma Brother    Breast Cancer Paternal 53, Paternal Grandmother, Paternal Aunt    Cervical Cancer Sister, Sister    Depression Mother    Heart Disease Mother    Melanoma Maternal Grandmother    Migraines Sister    Prostate Cancer Paternal Grandfather          Current Outpatient Medications   Medication Sig Dispense Refill   . albuterol sulfate (PROVENTIL OR VENTOLIN OR PROAIR) 90 mcg/actuation Inhalation HFA Aerosol Inhaler Take 1-2 Puffs by inhalation Every 6 hours as needed 1 Each 3   . cetirizine (ZYRTEC) 10 mg Oral Tablet Take 10 mg by mouth Once per day as needed     . Cranberry 500 mg Oral Capsule Take by mouth     .  docosahexaenoic acid/epa (FISH OIL ORAL) Take by mouth     . Etonogestrel-Ethinyl Estradiol (ELURYNG) 0.12-0.015 mg/24 hr Vaginal Ring Insert 1 Each into the vagina One time for 1 dose 1 Each 11   . fludrocortisone (FLORINEF) 0.1 mg Oral Tablet Take 1 Tablet (0.1 mg total) by mouth Every morning with breakfast 30 Tablet 5   . fluticasone propionate (FLONASE) 50 mcg/actuation Nasal Spray, Suspension USE 1 SPRAY IN EACH NOSTRIL TWICE DAILY FOR ALLERGY SYMPTOMS 16 mL 5   . loratadine-pseudoephedrine (CLARITIN-D) 5-120 mg Oral Tablet Sustained Release 12 hr Take 1 Tablet by mouth Twice daily 60 Tablet 0     No current facility-administered medications for this visit.     Allergies   Allergen Reactions   . Morphine    . Penicillins      Fine rash and itching   . Hydrocodone-Acetaminophen Nausea/ Vomiting     Social History     Socioeconomic History   . Marital status: Single   . Number of children:  0   Tobacco Use   . Smoking status: Never Smoker   . Smokeless tobacco: Never Used   Vaping Use   . Vaping Use: Never used   Substance and Sexual Activity   . Alcohol use: Yes     Alcohol/week: 9.0 standard drinks     Types: 2 Glasses of wine, 2 Shots of liquor, 5 Standard drinks or equivalent per week     Comment: couple drinks 3 x per week   . Drug use: Not Currently     Types: Marijuana     Comment: smoked in the past   . Sexual activity: Yes     Partners: Male     Birth control/protection: Ring       Review of Systems  Constitutional: negative  Respiratory: negative  Cardiovascular: negative  Gastrointestinal: vomiting and diarrhea associated with pelvic pain  Genitourinary: pelvic pain, dysmenorrhea  Integument/breast: negative  Hematologic/lymphatic: negative  Musculoskeletal:negative  Neurological: negative  Behavioral/Psych: negative  Endocrine: negative  Allergic/Immunologic: negative    Objective:      BP 108/68 (Site: Left, Patient Position: Sitting, Cuff Size: Adult)   Ht 1.549 m (5\' 1" )   Wt 55.3 kg (122 lb)    LMP 11/23/2020   BMI 23.05 kg/m       General: Patient is well-appearing and in no acute distress  Skin: no rash.  Neck:normal, supple, thyroid not enlarged, no lymphadenopathy.  Breast: Non-tender, no masses felt, no discharge from the nipples bilaterally, no axillary or supraclavicular lymphadenopathy.   Gynecological: Vulva normal appearing, bartholin, urethral meatus normal, cervix normal, normal uterus anteverted, and normal size. Adnexa normal/not felt. Pap smear and GC/Chlamydia culture done.     Assessment & Plan:       ICD-10-CM    1. Encounter for well woman exam with routine gynecological exam  Z01.419    2. Screening for cervical cancer  Z12.4 CYTOPATHOLOGY, GYN +/- HIGH RISK HPV   3. Screen for sexually transmitted diseases  Z11.3 CHLAMYDIA / NEISSERIA DNA BY PCR   4. Encounter for surveillance of vaginal ring hormonal contraceptive device  Z30.44 Etonogestrel-Ethinyl Estradiol (ELURYNG) 0.12-0.015 mg/24 hr Vaginal Ring   5. Dysmenorrhea  N94.6    6. Pelvic pain in female  R10.2      Will bring patient back in 2-3 weeks to discuss potential evaluation of endometriosis with Dr. Corey Skains. It was explained to patient that this is done through a laparoscopy.       Discussed patient complaints related to above findings and answered questions. Encouraged patient to have routine yearly screening as appropriate (including mammogram and pap smear).  Encouraged patient to call with results in 1-2 weeks. Will follow up in one year or sooner as needed.     Mikki Harbor, PA-C  12/07/2020, 12:31

## 2020-12-08 ENCOUNTER — Telehealth (INDEPENDENT_AMBULATORY_CARE_PROVIDER_SITE_OTHER): Payer: Self-pay | Admitting: INTERNAL MEDICINE

## 2020-12-08 NOTE — Telephone Encounter (Signed)
Canceled EGD/colon 11/30/2020, call to reschedule.Polo Riley, RN  12/08/2020, 08:32

## 2020-12-09 LAB — CYTOPATHOLOGY, GYN +/- HIGH RISK HPV

## 2020-12-11 LAB — HUMAN PAPILLOMA VIRUS (HPV) BY PCR WITH HIGH RISK GENOTYPING (THINPREP)
HPV OTHER: NEGATIVE
HPV16 PCR: NEGATIVE
HPV18 PCR: NEGATIVE

## 2020-12-12 ENCOUNTER — Other Ambulatory Visit: Payer: Self-pay | Admitting: Allergy and Immunology

## 2020-12-13 ENCOUNTER — Ambulatory Visit: Payer: Medicaid Other | Admitting: Allergy

## 2020-12-15 NOTE — Telephone Encounter (Addendum)
Call to pt to get EGD/COL r/s, pt states she is driving but she will call us back possibly today    Pt scheduled for 01/02/21 @ 12pm (Ada), pt request new instructions for Miralax prep emailed to her

## 2020-12-20 ENCOUNTER — Other Ambulatory Visit: Payer: Self-pay

## 2020-12-20 ENCOUNTER — Ambulatory Visit: Payer: MEDICAID | Attending: OBSTETRICS/GYNECOLOGY | Admitting: OBSTETRICS/GYNECOLOGY

## 2020-12-20 ENCOUNTER — Encounter (INDEPENDENT_AMBULATORY_CARE_PROVIDER_SITE_OTHER): Payer: Self-pay | Admitting: OBSTETRICS/GYNECOLOGY

## 2020-12-20 VITALS — Ht 61.0 in | Wt 122.0 lb

## 2020-12-20 DIAGNOSIS — Z6823 Body mass index (BMI) 23.0-23.9, adult: Secondary | ICD-10-CM

## 2020-12-20 DIAGNOSIS — R102 Pelvic and perineal pain: Secondary | ICD-10-CM | POA: Insufficient documentation

## 2020-12-20 DIAGNOSIS — N946 Dysmenorrhea, unspecified: Secondary | ICD-10-CM | POA: Insufficient documentation

## 2020-12-20 DIAGNOSIS — N920 Excessive and frequent menstruation with regular cycle: Secondary | ICD-10-CM | POA: Insufficient documentation

## 2020-12-20 NOTE — Progress Notes (Signed)
[facility]  Gynecology Clinic Note    Name: Kelly House  Date of Birth:  [birth year]  MRN: [id number]   Date of Service: 12/20/2020     CC:  Patient here to discuss her heavy menstrual periods and pelvic pain and cramping which occurs about a week before her periods.  Patient states she is concerned she may have endometriosis and here to discuss the same.    SUBJECTIVE: Kelly House is a 20 y.o. G0P0000 female who presents with complaints of heavy periods and having menstrual pain and cramping that starts about a week before her periods.  This has been going on for the last 8 months.  Patient is concerned she may have endometriosis and wished to discuss the same and further evaluation to check for endometriosis.  Patient at present is using NuvaRing for contraception.  Patient states that Ferdinand Lango have always been heavy and they come once a month but has been irregular in the past.  She gets her.  She may bleed for 6-7 days and heavy and sometimes last up to 3 weeks.  She then had another period in a month or some help.  Again in 2 weeks.  She will see is to get cramping with her periods. her cramping and pain had taken her to the emergency room for evaluation.  She was recommended during 1 of those visits to be seen by her gynecologist and be evaluated for endometriosis.  No family history of endometriosis.  Patient states she is also being evaluated for GI issues and is scheduled for a colonoscopy and and endoscopy.  Patient did have a pelvic ultrasound done on Jul 27, 2020 and was unremarkable.  Patient was recently seen in the office for her well-woman visit.      PMH, PSH, FH, and SH reviewed and updated.    CURRENT MEDICATIONS:   Current Outpatient Medications   Medication Sig   . albuterol sulfate (PROVENTIL OR VENTOLIN OR PROAIR) 90 mcg/actuation Inhalation HFA Aerosol Inhaler Take 1-2 Puffs by inhalation Every 6 hours as needed   . cetirizine (ZYRTEC) 10 mg Oral Tablet Take 10 mg by mouth  Once per day as needed   . Cranberry 500 mg Oral Capsule Take by mouth   . docosahexaenoic acid/epa (FISH OIL ORAL) Take by mouth   . fludrocortisone (FLORINEF) 0.1 mg Oral Tablet Take 1 Tablet (0.1 mg total) by mouth Every morning with breakfast   . fluticasone propionate (FLONASE) 50 mcg/actuation Nasal Spray, Suspension USE 1 SPRAY IN EACH NOSTRIL TWICE DAILY FOR ALLERGY SYMPTOMS   . loratadine-pseudoephedrine (CLARITIN-D) 5-120 mg Oral Tablet Sustained Release 12 hr Take 1 Tablet by mouth Twice daily       ALLERGIES:   Allergies   Allergen Reactions   . Morphine    . Penicillins      Fine rash and itching   . Hydrocodone-Acetaminophen Nausea/ Vomiting       PHYSICAL EXAMINATION:  Vitals:    12/20/20 0859   Weight: 55.3 kg (122 lb)   Height: 1.549 m (5\' 1" )   BMI: 23.1            OBGyn Exam deferred.  Ultrasound done reviewed.    ASSESSMENT/PLAN: 20 y.o. female G0P0000 with menorrhagia, dysmenorrhea and pelvic cramping.                                      Concerned  for pelvic endometriosis.       Plan:  Discussed above complaints and findings with patient.  Discussed her pelvic ultrasound findings visual completely unremarkable.  Discussed with patient pelvic endometriosis and signs and symptoms.  Discussed with patient the diagnosis of pelvic endometriosis is done with laparoscopy.  Discussed with patient different treatment options with pros and cons if endometriosis is diagnosed.  Patient voiced understanding.  Patient is being evaluated for GI issues with a colonoscopy and endoscopy.  If the above testing are normal and patient still continues to have Pap pelvic cramping and dysmenorrhea and heavy periods and desires to have evaluation done to rule out endometriosis she can call us and we can set her up for a laparoscopy to check for endometriosis.  Patient voiced understanding.                                                 Margarite Gouge, MD      This note was partially generated using MModal Fluency  Direct system, and there may be some incorrect words, spellings, and punctuation that were not noted in checking the note before saving.

## 2021-01-02 ENCOUNTER — Inpatient Hospital Stay
Admission: RE | Admit: 2021-01-02 | Discharge: 2021-01-02 | Disposition: A | Discharge status: Home / Self Care | Payer: MEDICAID | Source: Ambulatory Visit | Attending: INTERNAL MEDICINE | Admitting: INTERNAL MEDICINE

## 2021-01-02 ENCOUNTER — Encounter (AMBULATORY_SURGERY_CENTER)
Admission: RE | Disposition: A | Discharge status: Home / Self Care | Payer: Self-pay | Source: Ambulatory Visit | Attending: INTERNAL MEDICINE

## 2021-01-02 ENCOUNTER — Other Ambulatory Visit: Payer: Self-pay

## 2021-01-02 ENCOUNTER — Encounter (AMBULATORY_SURGERY_CENTER): Payer: Self-pay | Admitting: INTERNAL MEDICINE

## 2021-01-02 ENCOUNTER — Other Ambulatory Visit: Payer: MEDICAID | Attending: INTERNAL MEDICINE | Admitting: INTERNAL MEDICINE

## 2021-01-02 ENCOUNTER — Ambulatory Visit (AMBULATORY_SURGERY_CENTER): Payer: MEDICAID | Admitting: Anesthesiology

## 2021-01-02 DIAGNOSIS — D125 Benign neoplasm of sigmoid colon: Secondary | ICD-10-CM | POA: Insufficient documentation

## 2021-01-02 DIAGNOSIS — K2950 Unspecified chronic gastritis without bleeding: Secondary | ICD-10-CM | POA: Insufficient documentation

## 2021-01-02 DIAGNOSIS — K317 Polyp of stomach and duodenum: Secondary | ICD-10-CM | POA: Insufficient documentation

## 2021-01-02 DIAGNOSIS — K5900 Constipation, unspecified: Secondary | ICD-10-CM | POA: Insufficient documentation

## 2021-01-02 DIAGNOSIS — R1013 Epigastric pain: Secondary | ICD-10-CM

## 2021-01-02 DIAGNOSIS — Z6822 Body mass index (BMI) 22.0-22.9, adult: Secondary | ICD-10-CM

## 2021-01-02 DIAGNOSIS — K640 First degree hemorrhoids: Secondary | ICD-10-CM

## 2021-01-02 DIAGNOSIS — R109 Unspecified abdominal pain: Secondary | ICD-10-CM | POA: Insufficient documentation

## 2021-01-02 DIAGNOSIS — R194 Change in bowel habit: Secondary | ICD-10-CM | POA: Insufficient documentation

## 2021-01-02 SURGERY — ASC EGD
Anesthesia: Monitor Anesthesia Care | Wound class: Clean Contaminated Wounds-The respiratory, GI, Genital, or urinary

## 2021-01-02 MED ORDER — LIDOCAINE (PF) 20 MG/ML (2 %) INJECTION SOLUTION
INTRAMUSCULAR | Status: AC
Start: 2021-01-02 — End: 2021-01-02
  Filled 2021-01-02: qty 5

## 2021-01-02 MED ORDER — FENTANYL (PF) 50 MCG/ML INJECTION SOLUTION
INTRAMUSCULAR | Status: AC
Start: 2021-01-02 — End: 2021-01-02
  Filled 2021-01-02: qty 2

## 2021-01-02 MED ORDER — ONDANSETRON HCL (PF) 4 MG/2 ML INJECTION SOLUTION
Freq: Once | INTRAMUSCULAR | Status: DC | PRN
Start: 2021-01-02 — End: 2021-01-02
  Administered 2021-01-02: 4 mg via INTRAVENOUS

## 2021-01-02 MED ORDER — LACTATED RINGERS INTRAVENOUS SOLUTION
INTRAVENOUS | Status: DC
Start: 2021-01-02 — End: 2021-01-02

## 2021-01-02 MED ORDER — LACTATED RINGERS INTRAVENOUS SOLUTION
INTRAVENOUS | Status: DC | PRN
Start: 2021-01-02 — End: 2021-01-02
  Administered 2021-01-02: 0 via INTRAVENOUS

## 2021-01-02 MED ORDER — EPHEDRINE SULFATE 50 MG/ML INTRAVENOUS SOLUTION
Freq: Once | INTRAVENOUS | Status: DC | PRN
Start: 2021-01-02 — End: 2021-01-02
  Administered 2021-01-02 (×2): 10 mg via INTRAVENOUS

## 2021-01-02 MED ORDER — PROPOFOL 10 MG/ML INTRAVENOUS EMULSION
INTRAVENOUS | Status: AC
Start: 2021-01-02 — End: 2021-01-02
  Filled 2021-01-02: qty 20

## 2021-01-02 MED ORDER — MIDAZOLAM 1 MG/ML INJECTION WRAPPER
INTRAMUSCULAR | Status: AC
Start: 2021-01-02 — End: 2021-01-02
  Filled 2021-01-02: qty 2

## 2021-01-02 MED ORDER — FENTANYL (PF) 50 MCG/ML INJECTION SOLUTION
Freq: Once | INTRAMUSCULAR | Status: DC | PRN
Start: 2021-01-02 — End: 2021-01-02
  Administered 2021-01-02 (×2): 50 ug via INTRAVENOUS

## 2021-01-02 MED ORDER — PANTOPRAZOLE 40 MG TABLET,DELAYED RELEASE
40.0000 mg | DELAYED_RELEASE_TABLET | Freq: Every day | ORAL | 0 refills | Status: AC
Start: 2021-01-02 — End: ?

## 2021-01-02 MED ORDER — SODIUM CHLORIDE 0.9 % (FLUSH) INJECTION SYRINGE
10.0000 mL | INJECTION | Freq: Three times a day (TID) | INTRAMUSCULAR | Status: DC
Start: 2021-01-02 — End: 2021-01-02

## 2021-01-02 MED ORDER — SODIUM CHLORIDE 0.9 % (FLUSH) INJECTION SYRINGE
10.0000 mL | INJECTION | INTRAMUSCULAR | Status: DC | PRN
Start: 2021-01-02 — End: 2021-01-02

## 2021-01-02 MED ORDER — EPHEDRINE SULFATE 50 MG/ML INTRAVENOUS SOLUTION
INTRAVENOUS | Status: AC
Start: 2021-01-02 — End: 2021-01-02
  Filled 2021-01-02: qty 1

## 2021-01-02 MED ORDER — LIDOCAINE HCL 20 MG/ML (2 %) INJECTION SOLUTION
Freq: Once | INTRAMUSCULAR | Status: DC | PRN
Start: 2021-01-02 — End: 2021-01-02
  Administered 2021-01-02: 11:00:00 100 mg

## 2021-01-02 MED ORDER — PROPOFOL 10 MG/ML IV BOLUS
INJECTION | Freq: Once | INTRAVENOUS | Status: DC | PRN
Start: 2021-01-02 — End: 2021-01-02
  Administered 2021-01-02: 100 mg via INTRAVENOUS
  Administered 2021-01-02: 20 mg via INTRAVENOUS
  Administered 2021-01-02: 30 mg via INTRAVENOUS
  Administered 2021-01-02 (×3): 50 mg via INTRAVENOUS

## 2021-01-02 MED ORDER — ONDANSETRON HCL (PF) 4 MG/2 ML INJECTION SOLUTION
INTRAMUSCULAR | Status: AC
Start: 2021-01-02 — End: 2021-01-02
  Filled 2021-01-02: qty 2

## 2021-01-02 MED ORDER — MIDAZOLAM 1 MG/ML INJECTION WRAPPER
Freq: Once | INTRAMUSCULAR | Status: DC | PRN
Start: 2021-01-02 — End: 2021-01-02
  Administered 2021-01-02: 2 mg via INTRAVENOUS

## 2021-01-02 SURGICAL SUPPLY — 50 items
ADAPTER CATH 11/32IN 1/8-3/8IN PLASTIC LL SYRG CONN POS GRIP RIDGE STRL LF  3/32IN TAPER (UROLOGICAL SUPPLIES)
ADAPTER CATH 11/32IN 1/8-3/8IN_3/32IN TPR PLASTIC LL SYRG (UROLOGICAL SUPPLIES)
BASIN EME 16OZ 9X3.8X2IN GRAD_FLXB DISP DST ROSE POLYPROP (MED SURG SUPPLIES) ×1
BASIN EME 8.4X3.8X2IN GRAD DISP DST ROSE POLYPROP C500ML LF (MED SURG SUPPLIES) ×1 IMPLANT
BLOCK BITE 54FR ELAS ADJ STRAP BLOX (ENDOSCOPIC SUPPLIES) ×1 IMPLANT
BLOCK BITE 54FR ELAS ADJ STRAP BLOX (INSTRUMENTS ENDOMECHANICAL) ×1
CATH ELHMST GLD PRB 7FR 300CM_BIPO RND DIST TIP STD CONN (ENDOSCOPIC SUPPLIES) IMPLANT
CATH ELHMST GLD PRB 7FR 300CM_BIPO RND DIST TIP STD CONN (INSTRUMENTS ENDOMECHANICAL)
CATH ELHMST GLD PROBE 10FR 300CM BIPOLAR RND DIST TIP FIRM (INSTRUMENTS ENDOMECHANICAL)
CATH ELHMST GLD PROBE 10FR 300CM BIPOLAR RND DIST TIP FIRM SHAFT STD CONN HMGLD STRL DISP 3.7MM MN (ENDOSCOPIC SUPPLIES) IMPLANT
CLIP ENDO RSL 360 L235 2.8MM_BRAID CATH ROT CNTRL KNOB (INSTRUMENTS ENDOMECHANICAL)
CLIP HMST BRD CATH ROT CONTROL KNOB STRL LF  DISP RSL 360 235CM (ENDOSCOPIC SUPPLIES) IMPLANT
DUPE USE ITEM 11496 - SYRINGE MONOJECT 60ML LF STRL LL TIP GRAD MED POLYPROP STD (MED SURG SUPPLIES) IMPLANT
DUPE USE ITEM 162462 - ADAPTER CATH 11/32IN 1/8-3/8IN PLASTIC LL SYRG CONN POS GRIP RIDGE STRL LF  3/32IN TAPER (UROLOGICAL SUPPLIES) IMPLANT
ELECTRODE PATIENT RTN 9FT VLAB C30- LB RM PHSV ACRL FOAM CORD NONIRRITATE NONSENSITIZE ADH STRP (SURGICAL CUTTING SUPPLIES) IMPLANT
ELECTRODE PATIENT RTN 9FT VLAB_REM C30- LB PLHSV ACRL FOAM (CUTTING ELEMENTS)
FORCEPS BIOPSY NEEDLE 240CM 2.2MM RJ 4 2.8MM STD CPC STRL (ENDOSCOPIC SUPPLIES) ×1 IMPLANT
FORCEPS BIOPSY NEEDLE 240CM 2.2MM RJ 4 2.8MM STD CPC STRL (INSTRUMENTS ENDOMECHANICAL) ×1
FORCEPS BIOPSY NEEDLE 240CM 2.2MM RJ 4 2.8MM STD CPC STRL DISP ORNG (ENDOSCOPIC SUPPLIES) IMPLANT
FORCEPS BIOPSY NEEDLE 240CM 2._2MM RJ 4 2.8MM STD CPC STRL (INSTRUMENTS ENDOMECHANICAL)
GOWN SURG LRG XLNG LGTH L3 HKLP ADJ NKLN RGLN SLEEVE BRTHBL (DRAPE/PACKS/SHEETS/OR TOWEL) ×1
GOWN SURG LRG XLNG LGTH L3 HKLP ADJ NKLN RGLN SLEEVE BRTHBL BCK PNL STRL LF  DISP GRN AERO BLU PRFRM (DRAPE/PACKS/SHEETS/OR TOWEL) ×1 IMPLANT
GW ENDOS 80CM CLEANGUIDE 4CM OTW RADOPQ DIL SPRG TIP (INSTRUMENTS ENDOMECHANICAL)
GW ENDOS 80CM CLEANGUIDE 4CM OTW RADOPQ DIL SPRG TIP CALIBRATE PLVN ESOPH STRL (ENDOSCOPIC SUPPLIES) IMPLANT
KIT 1.1+ DE BIOPSY BX20 (INSTRUMENTS ENDOMECHANICAL) ×1
KIT ENDOS CMPLN ENDOKIT ORCAPOD 4 2 END 1.1OZ (ENDOSCOPIC SUPPLIES) ×1 IMPLANT
MANIFLD SUCT NPTN 2 2 STD 4 PORT WASTE MGMT SYS NONST LF  DISP (MED SURG SUPPLIES) ×1 IMPLANT
MANIFLD SUCT NPTN 2 STD 1 PORT WASTE MGMT SYS NONST LF  DISP (MED SURG SUPPLIES) ×1 IMPLANT
MANIFLD SUCT NPTN 2 STD 1 PORT_WASTE MGMT SYS NONST LF DISP (MED SURG SUPPLIES) ×1
MANIFLD SUCT NPTN 2 STD 4 PORT_WASTE MGMT SYS NONST LF DISP (MED SURG SUPPLIES) ×1
MARKER ENDOS SPOT EX PERM IND DRK SYRG 5ML (MED SURG SUPPLIES) IMPLANT
MARKER ENDOS SPOT EX PREFL PRE_ASSEMBLE SYRG PERM (MED SURG SUPPLIES)
NEEDLE SCLRTX 25GA 2.3MM BVL STRL DISP STAR CATH INTJCT 4MM 240CM (ENDOSCOPIC SUPPLIES) IMPLANT
NEEDLE SCLRTX 25GA 2.3MM BVL S_TRL DISP STAR CATH INTJCT 4MM (INSTRUMENTS ENDOMECHANICAL)
PAD PREP 43.5X24IN PCH OR NONST LF  DISP (DRAPE/PACKS/SHEETS/OR TOWEL) ×1 IMPLANT
PAD PREP 43.5X24IN PCH OR NONS_T LF DISP (DRAPE/PACKS/SHEETS/OR TOWEL) ×1
SET TUBING ERBEFLO CLEVERCAP HBRD CO2 DEHP-FR OLMPS SCP CO2 SRC DISP (ENDOSCOPIC SUPPLIES) ×1 IMPLANT
SET TUBING ERBEFLO CLEVERCAP H_BRD CO2 DEHP-FR OLMPS SCP CO2 (INSTRUMENTS ENDOMECHANICAL) ×1
SNARE RND 240CM 2.4MM CAPTIVATR COLD STF THN WRE ENDOS PLPCTM 10MM DISP (ENDOSCOPIC SUPPLIES) ×1 IMPLANT
SNARE SM OVAL 240CM 2.4MM SNS LOOP SHRTHRW FLXB ENDOS PLPCTM (INSTRUMENTS ENDOMECHANICAL)
SNARE SM OVAL 240CM 2.4MM SNS LOOP SHRTHRW FLXB ENDOS PLPCTM 13MM STRL DISP (ENDOSCOPIC SUPPLIES) IMPLANT
SNARE SURG 10MM RND COLD (INSTRUMENTS ENDOMECHANICAL) ×1
TRAP SPECI REM TRPS QD 4 REM CHAMBER SAF SCRN PARABOLA SLOT (ENDOSCOPIC SUPPLIES) ×1 IMPLANT
TRAP SPECI REM TRPS QD 4 REM C_HAMBER SAF SCRN PARABOLA SLOT (INSTRUMENTS ENDOMECHANICAL) ×1
TUBING SUCT 10FT .25IN AMSURE NONST LF (MED SURG SUPPLIES) ×1
TUBING SUCT LIGHT BLU 10FT .25IN AMSURE FEMALE CONN BPA FREE COLLAPSE RST FLXB NONST LF  DISP (MED SURG SUPPLIES) ×1 IMPLANT
USE ITEM 117320 CLIP HMST BRD CATH ROT CONTROL KNOB STRL LF  DISP RSL 360 235CM (ENDOSCOPIC SUPPLIES) IMPLANT
USE ITEM 82891 SNARE SM OVAL 240CM 2.4MM SNS LOOP SHRTHRW FLXB ENDOS PLPCTM 13MM STRL DISP (ENDOSCOPIC SUPPLIES) IMPLANT
WATER STRL 1000ML PLASTIC PR BTL LF (MED SURG SUPPLIES) ×1 IMPLANT
WATER STRL 1000ML PLASTIC PR B_TL LF (MED SURG SUPPLIES) ×1

## 2021-01-02 NOTE — Discharge Instructions (Addendum)
SURGICAL DISCHARGE INSTRUCTIONS     Dr. Jarold Song, Humberto Leep, MD  performed your ASC EGD, [place] [facility] today at the [facility]:       EGD: gastritis, gastric polyp x 1 removed    Colonoscopy: polyp x 1 removed, internal hemorrhoids,    [facility]:  Monday through Friday from 7 a.m. - 3 p.m.: [phone]    ANESTHESIA INFORMATION   ANESTHESIA -- ADULT PATIENTS:  You have received intravenous sedation / general anesthesia, and you may feel drowsy and light-headed for several hours. You may even experience some forgetfulness of the procedure. DO NOT DRIVE A MOTOR VEHICLE or perform any activity requiring complete alertness or coordination until you feel fully awake in about 24-48 hours. Do not drink alcoholic beverages for at least 24 hours. Do not stay alone, you must have a responsible adult available to be with you. You may also experience a dry mouth or nausea for 24 hours. This is a normal side effect and will disappear as the effects of the medication wear off.    REMEMBER   If you experience any difficulty breathing, chest pain, bleeding that you feel is excessive, persistent nausea or vomiting or for any other concerns:  Call your physician Dr De Nurse [phone] . You may also ask to have the general doctor on call paged. They are available to you 24 hours a day.    Or you many go to the ER for evaluation.    SPECIAL INSTRUCTIONS / COMMENTS   If you had a biopsy or a polyp removed the results will be available within 2 weeks.  If you do not receive a call from the office please call them at [phone]    If you are not nauseated, you may resume a normal diet.    If you had an EGD you may have a sore throat for a couple of days.  You can use Chloraseptic lozenges or salt water gargles.    If you had a colonoscopy: mild nausea, cramping, bloating, and abdominal discomfort are common.  This is due to the gas used to inflate the colon for examination.  DO NOT take  laxatives or enemas or these will aggravate the problem.  This will pass as you eliminate the gas on your own.  A small amount of bright red blood with your first bowel movement is normal.     Call the doctors office for:  1) Abdominal or chest pain that worsens after you leave.  Some fullness and bloating of the abdomen is normal after the procedure and will subside as you pass the gas.    2) Continued vomiting, especially if bloody.  3) Bright red or black stools.  4) Fever (Temperature greater than 101 degrees)  5) Shortness of breath, trouble swallowing, or severe lightheadedness.  6)  Redness, discomfort, swelling, or drainage from your IV site.  If this is mild, this may be treated with a warm compress to the area.     FOLLOW-UP APPOINTMENTS   Please call your surgeon's office at the number listed to schedule a date / time of return for follow-up.

## 2021-01-02 NOTE — H&P (Signed)
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  PMH AND SURGICAL HISTORY:  Past Medical History:   Diagnosis Date   . Anxiety    . Constipation    . Depression    . Kidney stone    . Recurrent UTI      Past Surgical History:   Procedure Laterality Date   . HX ADENOIDECTOMY     . HX TONSILLECTOMY     . HX WISDOM TEETH EXTRACTION       Medications Prior to Admission     Prescriptions    albuterol sulfate (PROVENTIL OR VENTOLIN OR PROAIR) 90 mcg/actuation Inhalation HFA Aerosol Inhaler    Take 1-2 Puffs by inhalation Every 6 hours as needed    cetirizine (ZYRTEC) 10 mg Oral Tablet    Take 10 mg by mouth Once per day as needed    Cranberry 500 mg Oral Capsule    Take by mouth    docosahexaenoic acid/epa (FISH OIL ORAL)    Take by mouth    Etonogestrel-Ethinyl Estradiol (ELURYNG) 0.12-0.015 mg/24 hr Vaginal Ring    Insert 1 Each into the vagina One time for 1 dose    fludrocortisone (FLORINEF) 0.1 mg Oral Tablet    Take 1 Tablet (0.1 mg total) by mouth Every morning with breakfast    fluticasone propionate (FLONASE) 50 mcg/actuation Nasal Spray, Suspension    USE 1 SPRAY IN EACH NOSTRIL TWICE DAILY FOR ALLERGY SYMPTOMS    loratadine-pseudoephedrine (CLARITIN-D) 5-120 mg Oral Tablet Sustained Release 12 hr    Take 1 Tablet by mouth Twice daily        Allergies   Allergen Reactions   . Morphine    . Penicillins      Fine rash and itching   . Hydrocodone-Acetaminophen Nausea/ Vomiting       SOCIAL HISTORY:   reports that she has never smoked. She has never used smokeless tobacco. She reports current alcohol use of about 9.0 standard drinks per week. She reports that she does not currently use drugs after having  used the following drugs: Marijuana.    FAMILY HISTORY:    family history includes Anxiety in her mother; Asthma in her brother; Breast Cancer in her paternal aunt, paternal aunt, and paternal grandmother; Cervical Cancer in her sister and sister; Depression in her mother; Heart Disease in her mother; Melanoma in her maternal grandmother; Migraines in her sister; Prostate Cancer in her paternal grandfather.    CURRENT HOME MEDICATIONS:  No outpatient medications have been marked as taking for the 01/02/21 encounter [facility] Encounter).        ROS:  Review of all systems in detail were negative except what I mentioned in my history of present illness and PMH.     EXAM:  No data found.     General:  Kelly House appears stated age, pleasant cooperative with exam, no acute distress  HEENT:  Head normocephalic, atraumatic, PERRLA.  Conjunctiva anicteric.  Neck:  Supple, no thyromegaly, trachea midline, no bruit.    Cardiovascular:  Heart regular rhythm rate, no cyanosis or clubbing, no rub, no edema  Vascular:  Pulses palpable bilateral no cyanosis or clubbing.  Pulmonary:  Lungs are clear to auscultation in all lungs fields.    Gastrointestinal:  Abdomen is soft, no tenderness, no rebound tenderness, no rigidity, bowel sounds present, no bruit, no organomegaly.  Musculoskeletal:  Symmetric, No sign of erythema, warmth or tenderness of any joint  Skin:  Normal color, no tenderness, no jaundice  Neurological exam:  Alert oriented x 3, muscle strength is intact bilaterally, sensation light touch intact bilaterally  Psychiatric:  mood and affect appropriate.    Assessment/Recommendations:   Abdominal pain and increased constipation.    Proceed with colonoscopy.  Proceed with EGD.      Dictation completed with M Modal system, please call me if information unclear.    Burgess Amor, MD  01/02/2021, 10:32

## 2021-01-02 NOTE — OR Surgeon (Addendum)
[facility]   Dr Simeon Craft, Kelly House, 20 y.o. female  Date of Admission:  01/02/2021  Date of service: 01/02/2021  Date of Birth:  [birth year]    SURGEON:  Burgess Amor, MD     PROCEDURE PERFORMED:  Colonoscopy with cold snare polypectomy    INDICATION:  Abdominal pain and change in bowel habit  HISTORY:  Patient is a 20 year old female with increased abdominal pain and change in bowel habit consistent of increased constipation.  She never had colonoscopy the past.  She had recent visit to the ER with constipation.  She changed her diet after ER visit with increasing fiber with improvement.    CONSENT:  The patient is competent.  The procedure was explained to the patient.Risks and benefits of  colonoscopy was discussed with the patient.  We discussed risk of bleeding, infection, cardiopulmonary complication, perforation that can results in significant morbidity and mortality, including colostomy and that.  We discussed risk of missing lesion that can result and delayed diagnosis and treatment of diseases including malignancy.  We also discussed the risk of missing colon cancer or precancerous polyps.    PHYSICAL EXAM:  Heart regular.    Lungs clear.    Abdominal soft and bowel sounds present.    PROCEDURE:  Prior to the procedure, history and physical was performed, and patient medications and allergies were reviewed.    After the risks and benefits of the procedure was discussed with the patient.  The patient deemed to be in satisfactory condition to proceed with the procedure.  After obtaining informed consent, the flexible colonoscope  was passed under direct visualization from the anus to the cecum .  Retroflexion was performed in the rectum.  Throughout the procedure the patient's blood pressure pulse, and oxygen saturation were monitored continuously.  The procedure was accomplished without immediate complication.    Withdrawal time 11 minutes  Prep quality was good.  Estimated blood loss  none.    FINDING:  Rectal exam was normal    4 millimeter sessile polyp was removed completely from the sigmoid colon with cold snare polypectomy    Mild grade 1 internal hemorrhoids were seen during retroflexion rectum.    Terminal ileum was intubated up to 15 centimeter and appeared normal.    Otherwise rest of the examined rectum, sigmoid, descending, transverse colon, ascending colon, and cecum appeared to be normal.    IMPRESSION:  One polyp was removed     Internal hemorrhoids     Otherwise normal colon and terminal ileum.    RECOMMENDATION:  Follow-up pathology report    Resume all home medications and diet today .    If pathology consistent with tubular adenoma or sessile serrated polyp, colonoscopy recommended in 5 years.  If pathology consistent with hyperplastic polyp, colonoscopy will be recommended at age 56.    Citrucel on a daily basis.      Patient's symptoms likely caused by irritable bowel syndrome with constipation.    Follow-up with primary care physician.    Send copy of this documents to the referring physician and primary care physician.      Burgess Amor, MD

## 2021-01-02 NOTE — Anesthesia Postprocedure Evaluation (Signed)
Anesthesia Post Op Evaluation    Patient: Kelly House  Procedure(s):  ASC EGD  ASC COLONOSCOPY DIAGNOSTIC    Last Vitals:Temperature: 36.3 C (97.4 F) (01/02/21 1135)  Heart Rate: (!) 103 (01/02/21 1135)  BP (Non-Invasive): (!) 105/39 (01/02/21 1135)  Respiratory Rate: 18 (01/02/21 1135)  SpO2: 100 % (01/02/21 1135)    No notable events documented.    Patient is sufficiently recovered from the effects of anesthesia to participate in the evaluation and has returned to their pre-procedure level.  Patient location during evaluation: PACU       Patient participation: complete - patient participated  Level of consciousness: awake and alert and responsive to verbal stimuli    Pain management: adequate  Airway patency: patent    Anesthetic complications: no  Cardiovascular status: acceptable  Respiratory status: acceptable  Hydration status: acceptable  Patient post-procedure temperature: Pt Normothermic   PONV Status: Absent

## 2021-01-02 NOTE — Anesthesia Preprocedure Evaluation (Signed)
ANESTHESIA PRE-OP EVALUATION  Planned Procedure: ASC EGD  ASC COLONOSCOPY DIAGNOSTIC  Review of Systems    PONV       patient summary reviewed  nursing notes reviewed        Pulmonary   asthma,   Cardiovascular  negative cardio ROS,   ECG reviewed ,No peripheral edema,        GI/Hepatic/Renal   negative GI/hepatic/renal ROS,  kidney stones        Endo/Other   neg endo/other ROS,       Neuro/Psych/MS    headaches, anxiety and depression     Cancer    negative hematology/oncology ROS,                   Physical Assessment      Airway       Mallampati: I    TM distance: >3 FB    Neck ROM: full              Dental       Dentition intact             Pulmonary    Breath sounds clear to auscultation  (-) no rhonchi, no decreased breath sounds, no wheezes, no rales and no stridor     Cardiovascular    Rhythm: regular  Rate: Normal  (-) no friction rub, carotid bruit is not present, no peripheral edema and no murmur     Other findings            Plan  ASA 2     Planned anesthesia type: MAC                           Anesthetic plan and risks discussed with patient  Signed consent obtained            Patient's NPO status is appropriate for Anesthesia.                   Urine Pregnancy Results: Negative

## 2021-01-02 NOTE — Anesthesia Transfer of Care (Signed)
ANESTHESIA TRANSFER OF CARE   Kelly House is a 20 y.o. ,female, Weight: 54.4 kg (120 lb)   had Procedure(s):  ASC EGD  ASC COLONOSCOPY DIAGNOSTIC  performed  01/02/21   Primary Service: Burgess Amor, MD    Past Medical History:   Diagnosis Date   . Anxiety    . Constipation    . Depression    . Kidney stone    . Recurrent UTI       Allergy History as of 01/02/21     PENICILLINS       Noted Status Severity Type Reaction    11/08/09 2111 Alanson Puls, RN 11/08/09 Active  Topical     Comments: Fine rash and itching           NO KNOWN DRUG ALLERGIES       Noted Status Severity Type Reaction    04/12/10 0904 Allison Quarry, MD 04/12/10 Deleted       04/12/10 7001 Allison Quarry, MD 04/12/10 Active             MORPHINE       Noted Status Severity Type Reaction    12/27/15 2052 Allison Quarry, MD 12/27/15 Active             HYDROCODONE-ACETAMINOPHEN       Noted Status Severity Type Reaction    04/14/20 0909 Merlinda Frederick, MA 04/11/14 Active Low  Nausea/ Vomiting              I completed my transfer of care / handoff to the receiving personnel during which we discussed:  Access, Airway, All key/critical aspects of case discussed, Analgesia, Expectation of post procedure, Fluids/Product, Gave opportunity for questions and acknowledgement of understanding, Labs and PMHx    Post Location: PACU                                                                  Last OR Temp: Temperature: 36.3 C (97.4 F)  ABG:  POTASSIUM   Date Value Ref Range Status   11/15/2020 3.7 3.5 - 5.1 mmol/L Final   08/22/2020 3.8 3.5 - 5.1 mmol/L Final   11/08/2009 4.0 3.5 - 5.1 mmol/L Final     KETONES   Date Value Ref Range Status   11/15/2020 Negative Negative mg/dL Final   02/21/2010 NEGATIVE NEGATIVE mg/dL Final     CALCIUM   Date Value Ref Range Status   11/15/2020 10.0 8.4 - 10.2 mg/dL Final   08/22/2020 9.3 8.5 - 10.0 mg/dL Final   11/08/2009 8.8 8.5 - 10.4 mg/dL Final     CANDIDA SPECIES   Date Value Ref Range Status    07/04/2020 Negative Negative Final     Airway:* No LDAs found *  Blood pressure (!) 105/39, pulse (!) 103, temperature 36.3 C (97.4 F), resp. rate 18, height 1.549 m (5\' 1" ), weight 54.4 kg (120 lb), last menstrual period 12/19/2020, SpO2 100 %, not currently breastfeeding.

## 2021-01-02 NOTE — OR Nursing (Signed)
Pictures-18

## 2021-01-02 NOTE — OR Surgeon (Signed)
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    INDICATION:  Epigastric pain    HISTORY:  20 year old female with epigastric pain.  EGD scheduled today for further evaluation    CONSENT:  The patient is competent.  The procedure was explained to the patient.Risks and benefits of endoscopy was discussed with the patient.  We discussed risk of bleeding, infection, cardiopulmonary complication, perforation that can results in significant morbidity and mortality.  We discussed risk of missing lesion that can result and delayed diagnosis and treatment of diseases including malignancy.    PHYSICAL EXAM:  Heart regular.    Lungs clear.    Abdominal soft and bowel sounds present.    PROCEDURE:  Prior to the procedure, history and physical was performed, and patient medications and allergies were reviewed.    After the risks and benefits of the procedure was discussed with the patient.  The patient deemed to be in satisfactory condition to proceed with the procedure.  After obtaining informed consent, the flexible gastroscope was passed under direct visualization from the mouth to the 2nd portion of the duodenum.  Retroflexion was performed in the stomach.  Throughout the procedure the patient's blood pressure pulse, and oxygen saturation were monitored continuously.  The procedure was accomplished without immediate complication.    Estimated blood loss none.    FINDING:  Esophagus:  Entire examined esophagus appeared to be normal, gastroesophageal junction and squamocolumnar junction was normal at 40 cm from the incisor.  No Barrett's esophagus was seen.    Stomach:  4 mm sessile polyp was removed from the cardia of the stomach with cold snare polypectomy.     Mild gastritis  characterized by erythema in the body and antrum of the stomach.  Biopsies were taken with forceps from the antrum body of the stomach and sent for pathology to rule out H pylori.    Otherwise the rest of the examined stomach, including retroflexion appeared to be normal.    Duodenum:  Entire examined duodenum appeared to be normal.  Random biopsies were taken forceps to rule celiac disease.    IMPRESSION:  Mild gastritis, biopsied     Small polyp removed stomach    Otherwise normal endoscopy.      Random biopsies were from the duodenum.    RECOMMENDATION:  Follow-up pathology report     Avoid NSAID and carbonated     Protonix for 2 months    Proceed with colonoscopy as scheduled.  Send copy of this documents to the referring physician and primary care physician.        Burgess Amor, MD

## 2021-01-04 DIAGNOSIS — K635 Polyp of colon: Secondary | ICD-10-CM

## 2021-01-04 LAB — SURGICAL PATHOLOGY SPECIMEN

## 2021-01-05 ENCOUNTER — Encounter (INDEPENDENT_AMBULATORY_CARE_PROVIDER_SITE_OTHER): Payer: Self-pay | Admitting: INTERNAL MEDICINE

## 2021-01-10 ENCOUNTER — Encounter: Payer: Self-pay | Admitting: Allergy

## 2021-01-10 ENCOUNTER — Other Ambulatory Visit: Payer: Self-pay

## 2021-01-10 ENCOUNTER — Ambulatory Visit (INDEPENDENT_AMBULATORY_CARE_PROVIDER_SITE_OTHER): Payer: Medicaid Other | Admitting: Allergy

## 2021-01-10 VITALS — BP 110/60 | HR 86 | Resp 16 | Ht 67.0 in | Wt 154.8 lb

## 2021-01-10 DIAGNOSIS — J3089 Other allergic rhinitis: Secondary | ICD-10-CM

## 2021-01-10 DIAGNOSIS — J4550 Severe persistent asthma, uncomplicated: Secondary | ICD-10-CM

## 2021-01-10 MED ORDER — MONTELUKAST SODIUM 10 MG PO TABS: ORAL_TABLET | ORAL | 5 refills | Status: DC

## 2021-01-10 NOTE — Progress Notes (Signed)
Follow-up Note  RE: Katelyn Best MRN: [id number] DOB: Feb 09, 2001 Date of Office Visit: 01/10/2021   History of present illness: Katelyn Best is a 20 y.o. female presenting today for follow-up of allergic rhinitis.  She was last seen in the office on 06/07/2020 by myself.  She had surgery on her right foot Aug 1.  Surgery and recovery went well.  She has been out of the cast 6 weeks now.    She states state her asthma has been very "rickety" lately.  About 1-2 weeks now having more issues with her asthma.  Noting difficulty breathing, tightness, cough.  She has had to use her nebulizer more but feels it is malfunctioning.  She states it turns off and on at random times during the neb treatment.  She also feels like it is getting overheated.  She states she received this nebulizer back in 2019.  She did go to UC for these symptoms and she did get steroid injection and steroid pack.  Albuterol was refilled.  She is still using albuterol inhaler and nebulizer as needed.  She states cough is improved since this weekend.  She has not been on Dupixent since last visit.  She states with her lots of illnesses this year and her surgery it has been difficult to get it in.  She has not had any Dupixent since her last visit.  Check she states she has not been able to refill her medication via the [facility] at she believes since March.  She does have 1 dose at home that is currently at her boyfriend's house. She is back living with her mother.    Allergy cells seem to be doing okay and she does have access to long-acting antihistamines as well as to nasal spray Astelin and eyedrop Pataday.  Review of systems: Review of Systems  Constitutional: Negative.   HENT: Negative.    Eyes: Negative.   Respiratory:  Positive for cough, shortness of breath and wheezing.   Cardiovascular: Negative.   Gastrointestinal: Negative.   Musculoskeletal: Negative.   Skin: Negative.   Neurological: Negative.    All  other systems negative unless noted above in HPI  Past medical/social/surgical/family history have been reviewed and are unchanged unless specifically indicated below.  No changes  Medication List: Current Outpatient Medications  Medication Sig Dispense Refill   acetaminophen (TYLENOL) 500 MG tablet Take 500-1,000 mg by mouth every 6 (six) hours as needed for headache (pain/ menstrual cramps).     albuterol (PROVENTIL) (2.5 MG/3ML) 0.083% nebulizer solution Take 3 mLs (2.5 mg total) by nebulization every 4 (four) hours as needed for wheezing or shortness of breath. 75 mL 1   CONCERTA 27 MG CR tablet Take 27 mg by mouth every morning.     DUPIXENT 300 MG/2ML SOPN INJECT 1 PEN UNDER THE SKIN EVERY OTHER WEEK 4 mL 11   EPINEPHrine 0.3 mg/0.3 mL IJ SOAJ injection Use as directed for life threatening allergic reactions 2 Device 3   escitalopram (LEXAPRO) 20 MG tablet Take 20 mg by mouth at bedtime.     LATUDA 60 MG TABS Take 60 mg by mouth daily.      levocetirizine (XYZAL) 5 MG tablet TAKE ONE TABLET BY MOUTH EVERY DAY FOR RUNNY NOSE OR ITCHING 30 tablet 5   montelukast (SINGULAIR) 10 MG tablet Take one tablet once daily 30 tablet 5   Multiple Vitamin (MULTIVITAMIN) capsule Take by mouth.     nortriptyline (PAMELOR) 10 MG capsule Take 3  capsules (30 mg total) by mouth at bedtime. Schedule appointment for additional refills. 270 capsule 0   Nutritional Supplements (NUTRITIONAL SUPPLEMENT PO) Take by mouth. Nature's Bounty Anxiety and Stress Relief     pantoprazole (PROTONIX) 40 MG tablet Take 40 mg by mouth daily.     PROAIR HFA 108 (90 Base) MCG/ACT inhaler INHALE TWO PUFFS INTO THE LUNGS EVERY 4 HOURS AS NEEDED FOR WHEEZING/SOB 18 g 0   SUMAtriptan (IMITREX) 100 MG tablet Take at 1 tablet onset of severe migraine.  May repeat in 2 hours if headache persists or recurs.  Limit to 2 tablets in 24 hr period. 10 tablet 5   SYMBICORT 160-4.5 MCG/ACT inhaler Inhale two puffs twice daily to prevent  cough or wheeze.  Rinse, gargle, and spit after use. 1 Inhaler 5   venlafaxine XR (EFFEXOR-XR) 75 MG 24 hr capsule Take 75 mg by mouth daily.     No current facility-administered medications for this visit.     Known medication allergies: Allergies  Allergen Reactions   Other     Told by nephrology to avoid NSAIDs given CKD   Advair Diskus [Fluticasone-Salmeterol] Rash   Prednisone Rash   Qvar [Beclomethasone] Rash     Physical examination: Blood pressure 110/60, pulse 86, resp. rate 16, height 5\' 7"  (1.702 m), weight 154 lb 12.8 oz (70.2 kg), SpO2 98 %.  General: Alert, interactive, in no acute distress. HEENT: PERRLA, TMs pearly gray, turbinates non-edematous without discharge, post-pharynx non erythematous. Neck: Supple without lymphadenopathy. Lungs: Mildly decreased breath sounds bilaterally without wheezing, rhonchi or rales. {no increased work of breathing. CV: Normal S1, S2 without murmurs. Abdomen: Nondistended, nontender. Skin: Warm and dry, without lesions or rashes. Extremities:  No clubbing, cyanosis or edema. Neuro:   Grossly intact.  Diagnositics/Labs:  Spirometry: FEV1: 3.17L 87%, FVC: 3.57L 85%, ratio consistent with nonobstructive pattern  Assessment and plan:    Asthma, severe persistent   - Maintenance asthma medication currently: Symbicort 2 puffs twice a day    - resume Dupixent injections every 2 weeks to be given in office  - Continue Singulair 10 mg daily-take at bedtime  - Use albuterol inhaler 2-4 puffs or nebulizer 1 vial every 4-6 hours as needed for cough, wheeze, chest tightness or shortness of breath.  May use 15-20 minutes prior to activity.  Monitor frequency of use.  Asthma control goals:  Full participation in all desired activities (may need albuterol before activity) Albuterol use two time or less a week on average (not counting use with activity) Cough interfering with sleep two time or less a month Oral steroids no more than  once a year No hospitalizations  Allergic rhinitis    - continue dust mite avoidance    - Use long-acting antihistamine like Xyzal 5mg  daily as needed (may take additional dose if needed)   - Use nasal antihistamine, Astelin 2 sprays 1-2 times a day as needed for runny nose/drainage or itchy nose.  This is an nasal antihistamine   - for itchy/watery/red eyes use Pataday 1 drop each eye as needed daily  Follow-up 4-6 months or sooner if needed  I appreciate the opportunity to take part in [place] care. Please do not hesitate to contact me with questions. LSincerely,   , MD Allergy/Immunology [facility]

## 2021-01-10 NOTE — Patient Instructions (Signed)
Asthma, severe persistent   - Maintenance asthma medication currently: Symbicort 2 puffs twice a day    - resume Dupixent injections every 2 weeks given in office  - Continue Singulair 10 mg daily-take at bedtime  - Use albuterol inhaler 2-4 puffs or nebulizer 1 vial every 4-6 hours as needed for cough, wheeze, chest tightness or shortness of breath.  May use 15-20 minutes prior to activity.  Monitor frequency of use.  Asthma control goals:  Full participation in all desired activities (may need albuterol before activity) Albuterol use two time or less a week on average (not counting use with activity) Cough interfering with sleep two time or less a month Oral steroids no more than once a year No hospitalizations  Allergic rhinitis    - continue dust mite avoidance    - Use long-acting antihistamine like Xyzal 5mg  daily as needed (may take additional dose if needed)   - Use nasal antihistamine, Astelin 2 sprays 1-2 times a day as needed for runny nose/drainage or itchy nose.  This is an nasal antihistamine   - for itchy/watery/red eyes use Pataday 1 drop each eye as needed daily  Follow-up 4-6 months or sooner if needed

## 2021-01-17 ENCOUNTER — Ambulatory Visit (INDEPENDENT_AMBULATORY_CARE_PROVIDER_SITE_OTHER): Payer: Medicaid Other

## 2021-01-17 ENCOUNTER — Other Ambulatory Visit: Payer: Self-pay

## 2021-01-17 DIAGNOSIS — J4550 Severe persistent asthma, uncomplicated: Secondary | ICD-10-CM

## 2021-01-17 MED ORDER — DUPILUMAB 300 MG/2ML ~~LOC~~ SOSY
600.0000 mg | PREFILLED_SYRINGE | Freq: Once | SUBCUTANEOUS | Status: AC
  Administered 2021-01-17: 600 mg via SUBCUTANEOUS

## 2021-01-17 MED ORDER — DUPILUMAB 300 MG/2ML ~~LOC~~ SOSY
300.0000 mg | PREFILLED_SYRINGE | SUBCUTANEOUS | Status: AC
  Administered 2021-01-31 – 2021-02-16 (×2): 300 mg via SUBCUTANEOUS

## 2021-01-17 NOTE — Progress Notes (Signed)
Immunotherapy   Patient Details  Name: Katelyn Best MRN: [id number] Date of Birth: [birth year]  01/17/2021  [name] restarted dupixent injections, received 300mg  in left upper arm, and 300mg  in right lower abdomen, waited 30 minutes in office without  any reactions. Following schedule: Dupixent  Frequency:every 14 days Epi-Pen:Yes  Consent signed and patient instructions given.   01/17/2021, 1:57 PM

## 2021-01-31 ENCOUNTER — Telehealth: Payer: Self-pay | Admitting: Allergy

## 2021-01-31 ENCOUNTER — Other Ambulatory Visit: Payer: Self-pay

## 2021-01-31 ENCOUNTER — Ambulatory Visit (INDEPENDENT_AMBULATORY_CARE_PROVIDER_SITE_OTHER): Payer: Medicaid Other

## 2021-01-31 DIAGNOSIS — J4550 Severe persistent asthma, uncomplicated: Secondary | ICD-10-CM | POA: Diagnosis not present

## 2021-01-31 MED ORDER — LEVOCETIRIZINE DIHYDROCHLORIDE 5 MG PO TABS: ORAL_TABLET | ORAL | 5 refills | Status: AC

## 2021-01-31 NOTE — Telephone Encounter (Signed)
Rx for xyzal has been sent in to [facility]

## 2021-01-31 NOTE — Telephone Encounter (Signed)
Patient is requesting a refill on Xyzal to be sent to [facility].

## 2021-02-14 ENCOUNTER — Ambulatory Visit: Payer: Medicaid Other

## 2021-02-16 ENCOUNTER — Other Ambulatory Visit: Payer: Self-pay

## 2021-02-16 ENCOUNTER — Ambulatory Visit (INDEPENDENT_AMBULATORY_CARE_PROVIDER_SITE_OTHER): Payer: Medicaid Other | Admitting: *Deleted

## 2021-02-16 DIAGNOSIS — J4550 Severe persistent asthma, uncomplicated: Secondary | ICD-10-CM | POA: Diagnosis not present

## 2021-03-09 ENCOUNTER — Telehealth: Payer: Self-pay | Admitting: *Deleted

## 2021-03-09 ENCOUNTER — Encounter: Payer: Self-pay | Admitting: *Deleted

## 2021-03-09 NOTE — Telephone Encounter (Signed)
Tried to contact patient to advise appt needed sooner than her March appt since she restarted Dupixent need note with response to therapy in order for Ins to re-approve. Voicemail full- MYchart message sent to patient

## 2021-03-21 ENCOUNTER — Other Ambulatory Visit (HOSPITAL_BASED_OUTPATIENT_CLINIC_OR_DEPARTMENT_OTHER): Payer: Self-pay | Admitting: Family Medicine

## 2021-03-28 ENCOUNTER — Ambulatory Visit: Payer: Medicaid Other | Admitting: Allergy

## 2021-04-15 ENCOUNTER — Other Ambulatory Visit (HOSPITAL_BASED_OUTPATIENT_CLINIC_OR_DEPARTMENT_OTHER): Payer: Self-pay | Admitting: Family Medicine

## 2021-04-17 ENCOUNTER — Telehealth (HOSPITAL_BASED_OUTPATIENT_CLINIC_OR_DEPARTMENT_OTHER): Payer: Self-pay | Admitting: Family Medicine

## 2021-04-17 ENCOUNTER — Other Ambulatory Visit (HOSPITAL_BASED_OUTPATIENT_CLINIC_OR_DEPARTMENT_OTHER): Payer: Self-pay | Admitting: Family Medicine

## 2021-04-17 DIAGNOSIS — B079 Viral wart, unspecified: Secondary | ICD-10-CM

## 2021-04-17 NOTE — Telephone Encounter (Signed)
patient called to request a referral to dermatology for warts on her right index finger, she has had them frozen off but they just keep coming back and they are getting larger.

## 2021-05-20 ENCOUNTER — Other Ambulatory Visit (INDEPENDENT_AMBULATORY_CARE_PROVIDER_SITE_OTHER): Payer: Self-pay | Admitting: PHYSICIAN ASSISTANT

## 2021-05-20 DIAGNOSIS — Z3044 Encounter for surveillance of vaginal ring hormonal contraceptive device: Secondary | ICD-10-CM

## 2021-06-06 ENCOUNTER — Encounter: Payer: Self-pay | Admitting: Allergy

## 2021-06-06 ENCOUNTER — Ambulatory Visit (INDEPENDENT_AMBULATORY_CARE_PROVIDER_SITE_OTHER): Payer: Medicaid Other | Admitting: Allergy

## 2021-06-06 ENCOUNTER — Ambulatory Visit: Payer: Medicaid Other | Admitting: Allergy

## 2021-06-06 ENCOUNTER — Other Ambulatory Visit: Payer: Self-pay

## 2021-06-06 VITALS — BP 112/62 | HR 76 | Resp 16

## 2021-06-06 DIAGNOSIS — J3089 Other allergic rhinitis: Secondary | ICD-10-CM

## 2021-06-06 DIAGNOSIS — L309 Dermatitis, unspecified: Secondary | ICD-10-CM | POA: Diagnosis not present

## 2021-06-06 DIAGNOSIS — J4551 Severe persistent asthma with (acute) exacerbation: Secondary | ICD-10-CM | POA: Diagnosis not present

## 2021-06-06 MED ORDER — MONTELUKAST SODIUM 10 MG PO TABS: ORAL_TABLET | ORAL | 5 refills | Status: AC

## 2021-06-06 MED ORDER — SYMBICORT 160-4.5 MCG/ACT IN AERO: INHALATION_SPRAY | RESPIRATORY_TRACT | 5 refills | Status: DC

## 2021-06-06 NOTE — Patient Instructions (Addendum)
Asthma, severe persistent with current exacerbation ? - Maintenance asthma medication: Symbicort 2 puffs twice a day AND Singulair 10 mg daily at bedtime.  These will be refilled today ? - Resume Dupixent injections every 2 weeks given in office.  We will need to resubmit approval.  Once you receive your supply bring to the office to get back on this medication for your asthma control ? - Use albuterol inhaler 2-4 puffs or nebulizer 1 vial every 4-6 hours as needed for cough, wheeze, chest tightness or shortness of breath.  May use 15-20 minutes prior to activity.  Monitor frequency of use. ? - Complete steroid taper pack as directed by the urgent care clinician ? ?Asthma control goals:  ?Full participation in all desired activities (may need albuterol before activity) ?Albuterol use two time or less a week on average (not counting use with activity) ?Cough interfering with sleep two time or less a month ?Oral steroids no more than once a year ?No hospitalizations ? ?Allergic rhinitis  ?  - continue dust mite avoidance  ?  - Use long-acting antihistamine like Xyzal 5mg  daily as needed (may take additional dose if needed) ?  - Use nasal antihistamine, Astelin 2 sprays 1-2 times a day as needed for runny nose/drainage or itchy nose.  This is an nasal antihistamine ?  - for itchy/watery/red eyes use Pataday 1 drop each eye as needed daily ? ?Facial dermatitis ?- Complete steroid taper pack as directed by the urgent care clinician ?- I advised applying the mupirocin to the area under the nose and with a clean Q-tip to apply a small amount to the anterior nasal surface ? ?Follow-up 4-6 months or sooner if needed ? ? ?

## 2021-06-06 NOTE — Progress Notes (Signed)
? ? ?Follow-up Note ? ?RE: Katelyn Best MRN: [id number] DOB: June 14, 2000 ?Date of Office Visit: 06/06/2021 ? ? ?History of present illness: ?Katelyn Best is a 21 y.o. female presenting today for follow-up of asthma and allergies.  She was last seen in the office on 01/10/2021 by myself. ? ?She was seen in urgent care yesterday for asthma flare.  She feels the weather changes has been flaring up her symptoms.  She states the past 1-1.5 weeks symptoms have been getting worse with cough and wheeze.  She has been using albuterol several times a day for past week. She states on exam at the urgent care the clinician seeing her said it sounded like there was "no air getting to my right lung".  She was prescribed solumedrol taper pack for 7 days.  She started this today taking 2 tablets with breakfast.  She plans to take another 2 with lunch and the rest at dinner.  She states she needs to break up the dosing so she can take it with meals. ?She also states she had a reaction on her cheeks where she developed a rash and states she was told the Solu-Medrol should help.   ?She also states she developed a red area underneath her nose and states was prescribed mupirocin for this. She states he was taking mucinex to help with the allergy symptoms but states her nose was really runny thus she switched from mucinex to sudafed.  Then she developed the rash under the nose.   ?She has been out of her symbicort and singulair for about a week now as well.   ?She got Dupixent resumed in December and she wanted to get her injections in office moving for.  However has not been back to get dupixent injection since.  She states in January she had stomach bug for about 2 weeks around the time her injection was due that month.  Then she got hit with large a phone bill and her phone ended up being shut off so she was not able to contact anyone.  This situation has been rectified and she has a new phone. ? ?Review of systems: ?Review of  Systems  ?Constitutional: Negative.   ?HENT:  Positive for rhinorrhea.   ?Eyes: Negative.   ?Respiratory:  Positive for cough, shortness of breath and wheezing.   ?Cardiovascular: Negative.   ?Gastrointestinal: Negative.   ?Musculoskeletal: Negative.   ?Skin:  Positive for rash.  ?Allergic/Immunologic: Negative.   ?Neurological: Negative.    ? ?All other systems negative unless noted above in HPI ? ?Past medical/social/surgical/family history have been reviewed and are unchanged unless specifically indicated below. ? ?No changes ? ?Medication List: ?Current Outpatient Medications  ?Medication Sig Dispense Refill  ? acetaminophen (TYLENOL) 500 MG tablet Take 500-1,000 mg by mouth every 6 (six) hours as needed for headache (pain/ menstrual cramps).    ? albuterol (PROVENTIL) (2.5 MG/3ML) 0.083% nebulizer solution Take 3 mLs (2.5 mg total) by nebulization every 4 (four) hours as needed for wheezing or shortness of breath. 75 mL 1  ? CONCERTA 27 MG CR tablet Take 27 mg by mouth every morning.    ? DUPIXENT 300 MG/2ML SOPN INJECT 1 PEN UNDER THE SKIN EVERY OTHER WEEK 4 mL 11  ? EPINEPHrine 0.3 mg/0.3 mL IJ SOAJ injection Use as directed for life threatening allergic reactions 2 Device 3  ? escitalopram (LEXAPRO) 20 MG tablet Take 20 mg by mouth at bedtime.    ? LATUDA 60 MG TABS Take  60 mg by mouth daily.     ? levocetirizine (XYZAL) 5 MG tablet TAKE ONE TABLET BY MOUTH EVERY DAY 30 tablet 5  ? methylPREDNISolone (MEDROL DOSEPAK) 4 MG TBPK tablet Take by mouth.    ? montelukast (SINGULAIR) 10 MG tablet Take one tablet once daily 30 tablet 5  ? Multiple Vitamin (MULTIVITAMIN) capsule Take by mouth.    ? mupirocin ointment (BACTROBAN) 2 % SMARTSIG:1 Application Topical 2-3 Times Daily    ? nortriptyline (PAMELOR) 10 MG capsule Take 3 capsules (30 mg total) by mouth at bedtime. Schedule appointment for additional refills. 270 capsule 0  ? Nutritional Supplements (NUTRITIONAL SUPPLEMENT PO) Take by mouth. Nature's Bounty  Anxiety and Stress Relief    ? pantoprazole (PROTONIX) 40 MG tablet Take 40 mg by mouth daily.    ? PROAIR HFA 108 (90 Base) MCG/ACT inhaler INHALE TWO PUFFS INTO THE LUNGS EVERY 4 HOURS AS NEEDED FOR WHEEZING/SOB 18 g 0  ? SUMAtriptan (IMITREX) 100 MG tablet Take at 1 tablet onset of severe migraine.  May repeat in 2 hours if headache persists or recurs.  Limit to 2 tablets in 24 hr period. 10 tablet 5  ? SYMBICORT 160-4.5 MCG/ACT inhaler Inhale two puffs twice daily to prevent cough or wheeze.  Rinse, gargle, and spit after use. 1 Inhaler 5  ? venlafaxine XR (EFFEXOR-XR) 75 MG 24 hr capsule Take 75 mg by mouth daily.    ? ?Current Facility-Administered Medications  ?Medication Dose Route Frequency Provider Last Rate Last Admin  ? dupilumab (DUPIXENT) prefilled syringe 300 mg  300 mg Subcutaneous Q14 Days Marcelyn BruinsPadgett, Yamilex Borgwardt Patricia, MD   300 mg at 02/16/21 1628  ?  ? ?Known medication allergies: ?Allergies  ?Allergen Reactions  ? Other   ?  Told by nephrology to avoid NSAIDs given CKD  ? Advair Diskus [Fluticasone-Salmeterol] Rash  ? Prednisone Rash  ? Qvar [Beclomethasone] Rash  ? ? ? ?Physical examination: ?Blood pressure 112/62, pulse 76, resp. rate 16, SpO2 98 %. ? ?General: Alert, interactive, in no acute distress. ?HEENT: PERRLA, TMs pearly gray, turbinates non-edematous without discharge, post-pharynx non erythematous. ?Neck: Supple without lymphadenopathy. ?Lungs: Mildly decreased breath sounds bilaterally without wheezing, rhonchi or rales. {no increased work of breathing. ?CV: Normal S1, S2 without murmurs. ?Abdomen: Nondistended, nontender. ?Skin: Cheeks with erythematous maculopapular rash bilaterally: Erythematous patch under the right nostril . ?Extremities:  No clubbing, cyanosis or edema. ?Neuro:   Grossly intact. ? ?Diagnositics/Labs: ?None today ? ?Assessment and plan: ? Asthma, severe persistent with current exacerbation ? - Maintenance asthma medication: Symbicort 160mcg 2 puffs twice a day AND  Singulair 10 mg daily at bedtime.  These will be refilled today ? - Resume Dupixent injections every 2 weeks given in office.  We will need to resubmit approval.  Once you receive your supply bring to the office to get back on this medication for your asthma control ? - Use albuterol inhaler 2-4 puffs or nebulizer 1 vial every 4-6 hours as needed for cough, wheeze, chest tightness or shortness of breath.  May use 15-20 minutes prior to activity.  Monitor frequency of use. ? - Complete steroid taper pack as directed by the urgent care clinician ? ?Asthma control goals:  ?Full participation in all desired activities (may need albuterol before activity) ?Albuterol use two time or less a week on average (not counting use with activity) ?Cough interfering with sleep two time or less a month ?Oral steroids no more than once a year ?No hospitalizations ? ?  Allergic rhinitis  ?  - continue dust mite avoidance  ?  - Use long-acting antihistamine like Xyzal 5mg  daily as needed (may take additional dose if needed) ?  - Use nasal antihistamine, Astelin 2 sprays 1-2 times a day as needed for runny nose/drainage or itchy nose.  This is an nasal antihistamine ?  - for itchy/watery/red eyes use Pataday 1 drop each eye as needed daily ? ?Facial dermatitis ?- Complete steroid taper pack as directed by the urgent care clinician ?- I advised applying the mupirocin to the area under the nose and with a clean Q-tip to apply a small amount to the anterior nasal surface ? ?Follow-up 4-6 months or sooner if needed ? ?I appreciate the opportunity to take part in [facility] care. Please do not hesitate to contact me with questions. ? ?Sincerely, ? ? ?[facility], MD ?Allergy/Immunology ?[facility] ? ? ?

## 2021-06-07 ENCOUNTER — Telehealth: Payer: Self-pay | Admitting: *Deleted

## 2021-06-07 NOTE — Telephone Encounter (Signed)
-----   Message from [place], MD sent at 06/06/2021 12:04 PM EDT ----- ?Patient needs to be back on Dupixent.  Please resubmit approval ?

## 2021-06-07 NOTE — Telephone Encounter (Signed)
Called patient and advised approval and resubmit for Dupixent to [place]. Patient wants admin in office will let her know when delivered ?

## 2021-06-28 IMAGING — DX DG CHEST 2V
2 series · 2 of 2 positions shown · non-contrast
Comparison: May 14, 2016

CLINICAL DATA: Dry cough and chest pain.

EXAM:
CHEST - 2 VIEW

[chest pa]
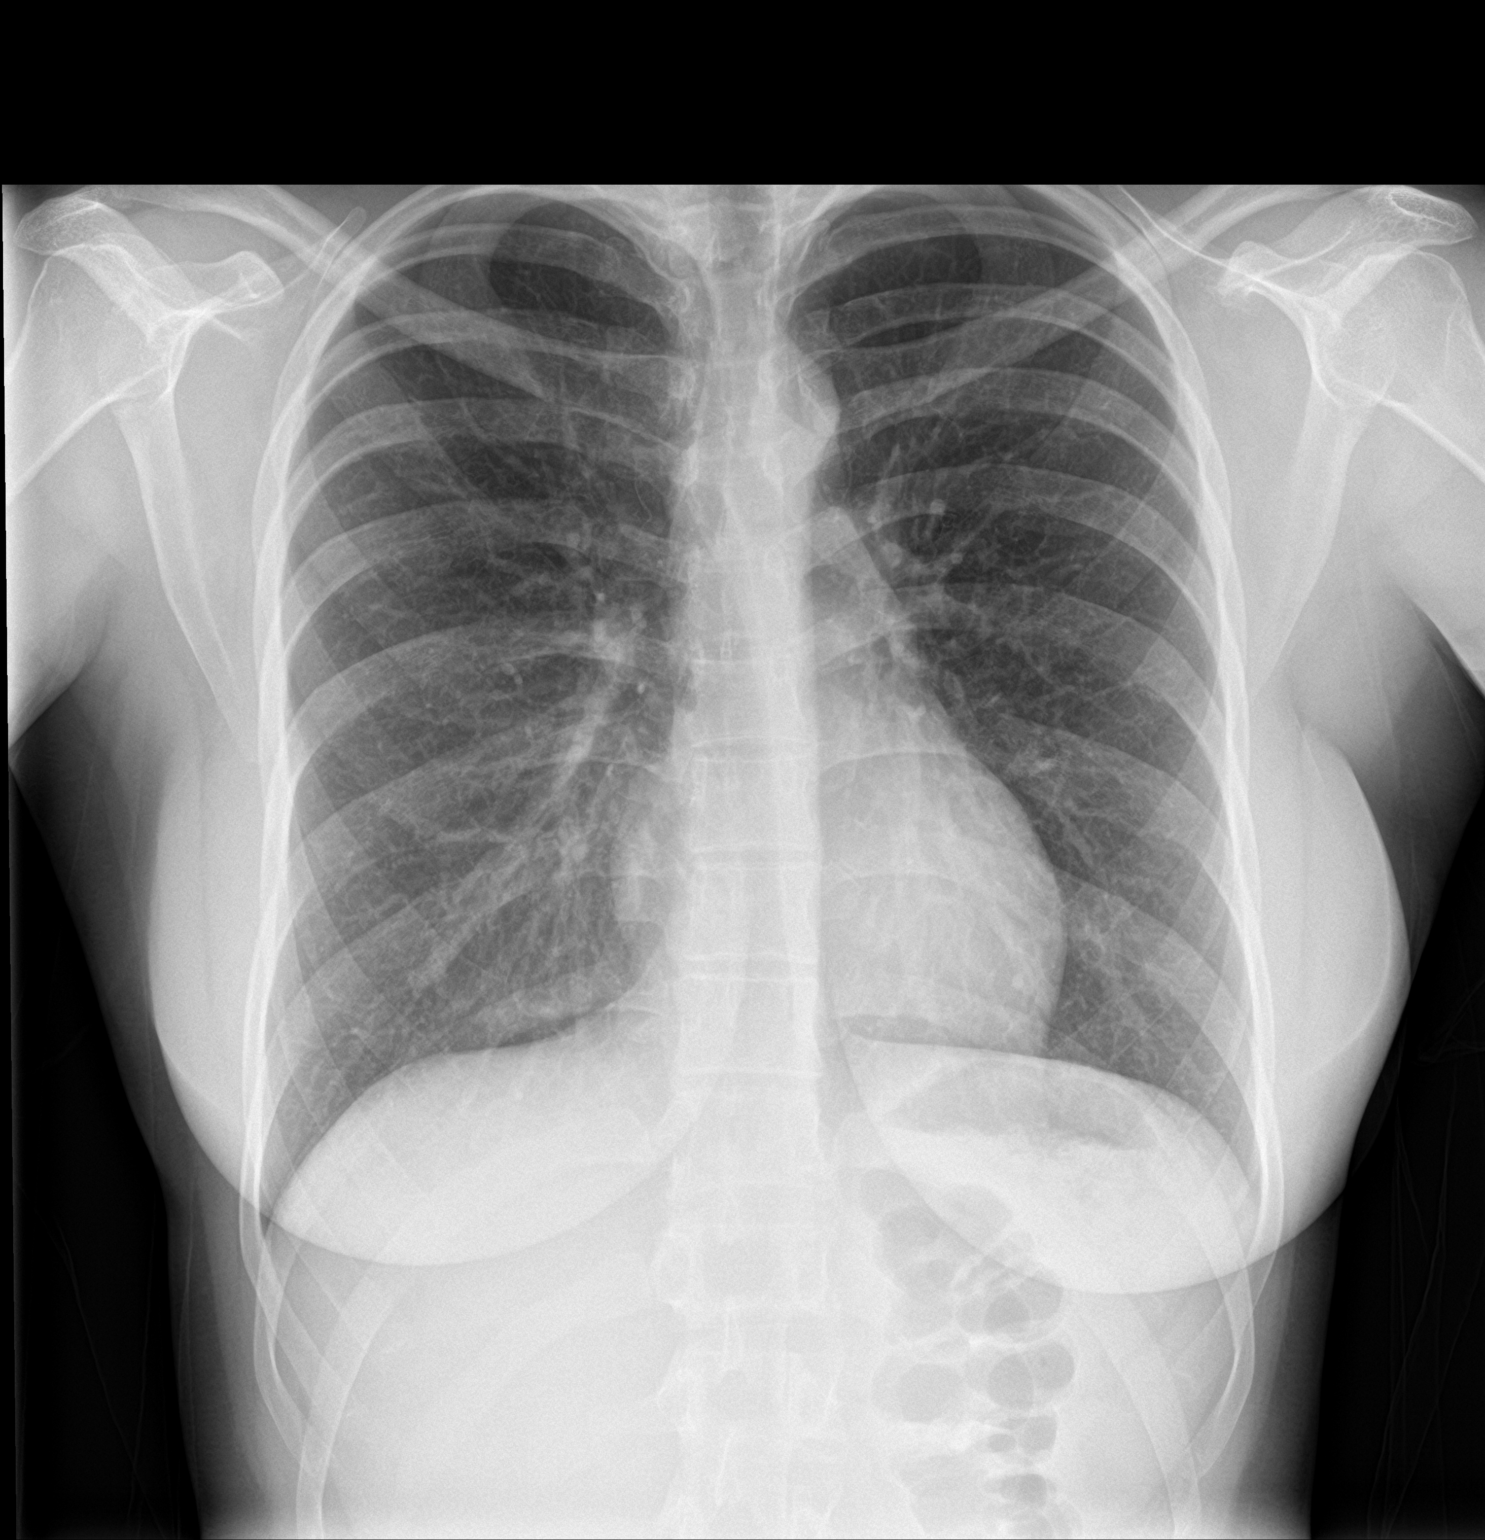

[chest lat]
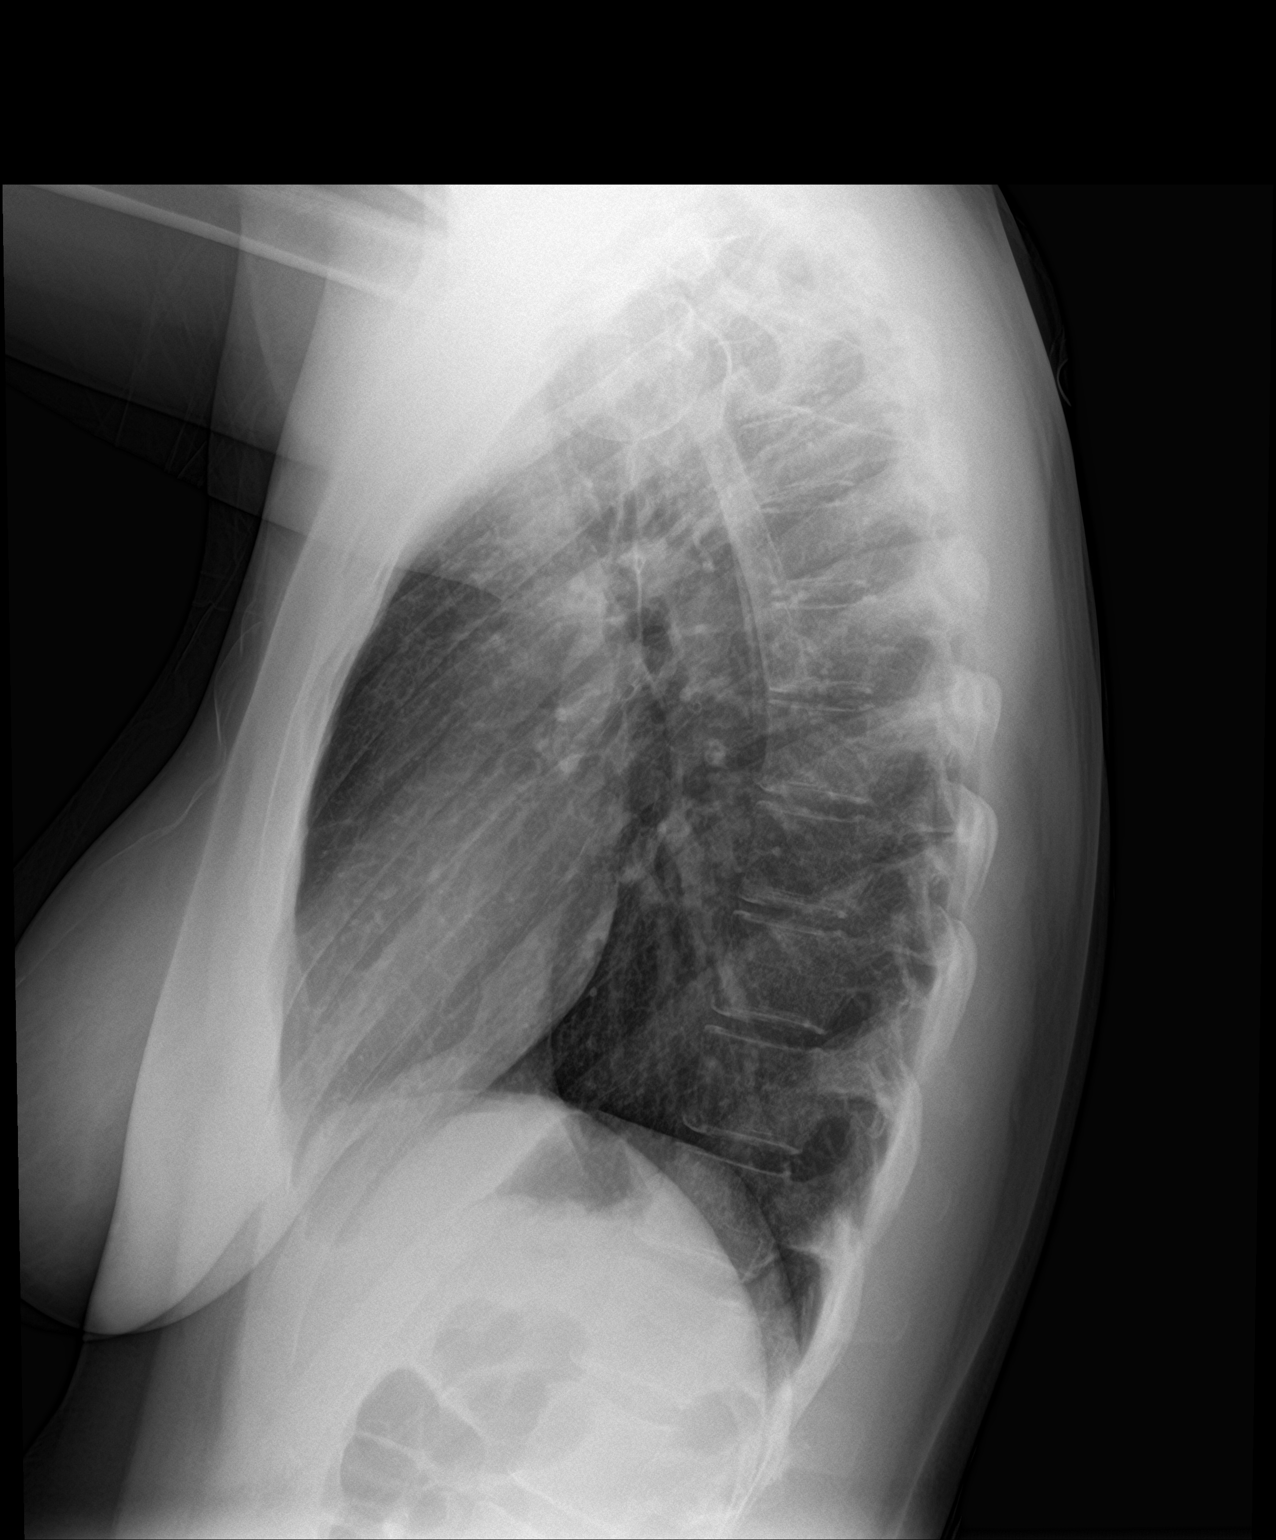

[2 of 2 positions shown; findings below may reference images not displayed]

FINDINGS: The heart size and mediastinal contours are within normal limits.
Both lungs are clear. The visualized skeletal structures are
unremarkable.
IMPRESSION: No active cardiopulmonary disease.

## 2021-08-15 ENCOUNTER — Other Ambulatory Visit (HOSPITAL_BASED_OUTPATIENT_CLINIC_OR_DEPARTMENT_OTHER): Payer: Self-pay | Admitting: Family Medicine

## 2021-08-15 MED ORDER — FLUDROCORTISONE 0.1 MG TABLET
0.1000 mg | ORAL_TABLET | Freq: Every morning | ORAL | 5 refills | Status: DC
Start: 2021-08-15 — End: 2022-02-09

## 2021-08-16 ENCOUNTER — Ambulatory Visit (HOSPITAL_BASED_OUTPATIENT_CLINIC_OR_DEPARTMENT_OTHER): Payer: MEDICAID | Admitting: Family Medicine

## 2021-09-21 ENCOUNTER — Other Ambulatory Visit (INDEPENDENT_AMBULATORY_CARE_PROVIDER_SITE_OTHER): Payer: Self-pay | Admitting: PHYSICIAN ASSISTANT

## 2021-09-21 DIAGNOSIS — Z3044 Encounter for surveillance of vaginal ring hormonal contraceptive device: Secondary | ICD-10-CM

## 2021-10-30 ENCOUNTER — Encounter (HOSPITAL_BASED_OUTPATIENT_CLINIC_OR_DEPARTMENT_OTHER): Payer: Self-pay

## 2021-10-30 ENCOUNTER — Other Ambulatory Visit (INDEPENDENT_AMBULATORY_CARE_PROVIDER_SITE_OTHER): Payer: Self-pay | Admitting: PHYSICIAN ASSISTANT

## 2021-10-30 ENCOUNTER — Telehealth (INDEPENDENT_AMBULATORY_CARE_PROVIDER_SITE_OTHER): Payer: Self-pay | Admitting: PHYSICIAN ASSISTANT

## 2021-10-30 ENCOUNTER — Other Ambulatory Visit (HOSPITAL_BASED_OUTPATIENT_CLINIC_OR_DEPARTMENT_OTHER): Payer: Self-pay | Admitting: Family Medicine

## 2021-10-30 DIAGNOSIS — Z3044 Encounter for surveillance of vaginal ring hormonal contraceptive device: Secondary | ICD-10-CM

## 2021-10-30 MED ORDER — NUVARING 0.12 MG-0.015 MG/24 HR VAGINAL
1.0000 | VAGINAL_RING | Freq: Once | VAGINAL | 1 refills | Status: AC
Start: 2021-10-30 — End: 2021-10-30

## 2021-10-30 NOTE — Telephone Encounter (Signed)
Pt.notified

## 2021-10-30 NOTE — Telephone Encounter (Signed)
Abigail Butts is completed documentation for Health plan.

## 2021-12-01 ENCOUNTER — Ambulatory Visit (INDEPENDENT_AMBULATORY_CARE_PROVIDER_SITE_OTHER): Payer: Self-pay | Admitting: OBSTETRICS/GYNECOLOGY

## 2021-12-05 ENCOUNTER — Ambulatory Visit: Payer: Medicaid Other | Admitting: Allergy

## 2021-12-05 ENCOUNTER — Ambulatory Visit: Payer: HMO | Attending: OBSTETRICS/GYNECOLOGY | Admitting: OBSTETRICS/GYNECOLOGY

## 2021-12-05 ENCOUNTER — Encounter (INDEPENDENT_AMBULATORY_CARE_PROVIDER_SITE_OTHER): Payer: Self-pay | Admitting: OBSTETRICS/GYNECOLOGY

## 2021-12-05 ENCOUNTER — Other Ambulatory Visit: Payer: Self-pay

## 2021-12-05 ENCOUNTER — Ambulatory Visit (INDEPENDENT_AMBULATORY_CARE_PROVIDER_SITE_OTHER): Payer: Self-pay | Admitting: PHYSICIAN ASSISTANT

## 2021-12-05 VITALS — BP 112/61 | Ht 61.0 in | Wt 129.0 lb

## 2021-12-05 DIAGNOSIS — Z124 Encounter for screening for malignant neoplasm of cervix: Secondary | ICD-10-CM | POA: Insufficient documentation

## 2021-12-05 DIAGNOSIS — Z113 Encounter for screening for infections with a predominantly sexual mode of transmission: Secondary | ICD-10-CM | POA: Insufficient documentation

## 2021-12-05 DIAGNOSIS — Z01419 Encounter for gynecological examination (general) (routine) without abnormal findings: Secondary | ICD-10-CM | POA: Insufficient documentation

## 2021-12-05 DIAGNOSIS — Z3044 Encounter for surveillance of vaginal ring hormonal contraceptive device: Secondary | ICD-10-CM | POA: Insufficient documentation

## 2021-12-05 DIAGNOSIS — R87610 Atypical squamous cells of undetermined significance on cytologic smear of cervix (ASC-US): Secondary | ICD-10-CM

## 2021-12-05 LAB — CHLAMYDIA / NEISSERIA DNA BY PCR
CHLAMYDIA TRACHOMATIS PCR: NOT DETECTED
NEISSERIA GONORRHOEAE PCR: NOT DETECTED

## 2021-12-05 MED ORDER — ETONOGESTREL 0.12 MG-ETHINYL ESTRADIOL 0.015 MG/24 HR VAGINAL RING
1.0000 | VAGINAL_RING | Freq: Once | VAGINAL | 11 refills | Status: DC
Start: 2021-12-05 — End: 2021-12-20

## 2021-12-05 NOTE — Progress Notes (Signed)
OB/GYN, [address]  [place] [address]  Operated by [facility]     Name: Kelly House MRN:  [id number]   Date: 12/05/2021 Age: 21 y.o.     Chief Complaint:  Here for well-woman visit and gynecological checkup.  No voiced complaints.  On NuvaRing for contraception and desires refills.    Subjective:     Kelly House is a 21 y.o. female who presents for annual exam. The patient has no   Voiced gynecoidcomplaints today.    Patient states her menstrual cycles are regular.  They come once every 28 days and lasts for 3-4 days.  Her bleeding is light.  She gets minimal menstrual cramping.  Patient is on NuvaRing for contraception and desires to continue and needs refills.  No major change in medical or surgical history.    Menstrual History:  OB History       Gravida   0    Para   0    Term   0    Preterm   0    AB   0    Living   0         SAB   0    IAB   0    Ectopic   0    Multiple   0    Live Births   0                Menarche age:  14 years.  Patient's last menstrual period was 11/23/2021 (exact date).  Cycle:  Regular  Interval:  28 days  Duration:  4-5 days  Blood flow light during.  Dysmenorrhea:  Minimal cramping.  Contraception: NuvaRing vaginal inserts    Past Medical History:   Diagnosis Date    Anxiety     Constipation     Depression     Kidney stone     Recurrent UTI      Past Surgical History:   Procedure Laterality Date    HX ADENOIDECTOMY      HX TONSILLECTOMY      HX WISDOM TEETH EXTRACTION       Family Medical History:       Problem Relation (Age of Onset)    Anxiety Mother    Asthma Brother    Breast Cancer Paternal 55, Paternal Grandmother, Paternal Aunt    Cervical Cancer Sister, Sister    Depression Mother    Heart Disease Mother    Melanoma Maternal Grandmother    Migraines Sister    Prostate Cancer Paternal Grandfather            Current Outpatient Medications   Medication Sig Dispense Refill    albuterol sulfate (PROVENTIL OR VENTOLIN OR PROAIR) 90  mcg/actuation Inhalation HFA Aerosol Inhaler Take 1-2 Puffs by inhalation Every 6 hours as needed 1 Each 3    cetirizine (ZYRTEC) 10 mg Oral Tablet Take 1 Tablet (10 mg total) by mouth Once per day as needed      Cranberry 500 mg Oral Capsule Take by mouth      docosahexaenoic acid/epa (FISH OIL ORAL) Take by mouth      Etonogestrel-Ethinyl Estradiol 0.12-0.015 mg/24 hr Vaginal Ring Insert 1 Each into the vagina One time for 1 dose 1 Each 11    fludrocortisone (FLORINEF) 0.1 mg Oral Tablet Take 1 Tablet (0.1 mg total) by mouth Every morning with breakfast 30 Tablet 5    fluticasone propionate (FLONASE) 50  mcg/actuation Nasal Spray, Suspension USE 1 SPRAY IN EACH NOSTRIL TWICE DAILY FOR ALLERGY SYMPTOMS (Patient taking differently: Twice per day as needed USE 1 SPRAY IN EACH NOSTRIL TWICE DAILY FOR ALLERGY SYMPTOMS) 16 mL 5    loratadine-pseudoephedrine (CLARITIN-D) 5-120 mg Oral Tablet Sustained Release 12 hr Take 1 Tablet by mouth Twice daily 60 Tablet 0    pantoprazole (PROTONIX) 40 mg Oral Tablet, Delayed Release (E.C.) Take 1 Tablet (40 mg total) by mouth Once a day 60 Tablet 0     No current facility-administered medications for this visit.     Allergies   Allergen Reactions    Morphine     Penicillins      Fine rash and itching    Hydrocodone-Acetaminophen Nausea/ Vomiting     Social History     Socioeconomic History    Marital status: Single    Number of children: 0   Tobacco Use    Smoking status: Never    Smokeless tobacco: Never   Vaping Use    Vaping Use: Never used   Substance and Sexual Activity    Alcohol use: Yes     Alcohol/week: 9.0 standard drinks     Types: 2 Glasses of wine, 2 Shots of liquor, 5 Standard drinks or equivalent per week     Comment: couple drinks 3 x per week    Drug use: Not Currently     Types: Marijuana     Comment: smoked in the past    Sexual activity: Yes     Partners: Male     Birth control/protection: Ring       Review of Systems  Constitutional: negative  Respiratory:  negative  Cardiovascular: negative  Gastrointestinal: negative  Genitourinary:negative  Integument/breast: negative  Hematologic/lymphatic: negative  Musculoskeletal:negative  Neurological: negative  Behavioral/Psych: negative  Endocrine: negative  Allergic/Immunologic: negative    Objective:      BP 112/61 (Site: Left, Patient Position: Sitting, Cuff Size: Adult)   Ht 1.549 m ('5\' 1"'$ )   Wt 58.5 kg (129 lb)   LMP 11/23/2021 (Exact Date)   BMI 24.37 kg/m       General: Patient is well-appearing and in no acute distress  Skin: no rash.  Neck:normal, supple, thyroid not enlarged, no lymphadenopathy.  Breast: Non-tender, no masses felt, no discharge from the nipples bilaterally, no axillary or supraclavicular lymphadenopathy. Scar none.  Bilateral nipple piercings.  Gastrointestinal: Normal bowel sounds,no masses, abdomen non-tender to palpation. Liver and spleen not palpable.  Belly button piercing noted.    Gynecological: Vulva normal appearing, bartholin, urethral meatus normal, cervix normal, normal uterus anteverted, and normal size. Adnexa normal.  NuvaRing noted vagina.  Pap smear taken.  GC and chlamydia culture taken from endocervix.    Assessment & Plan:       ICD-10-CM    1. Encounter for well woman exam with routine gynecological exam  Z01.419 CYTOPATHOLOGY, GYN +/- HIGH RISK HPV     CHLAMYDIA / NEISSERIA DNA BY PCR      2. Screening for cervical cancer  Z12.4 CYTOPATHOLOGY, GYN +/- HIGH RISK HPV      3. Screen for sexually transmitted diseases  Z11.3 CHLAMYDIA / NEISSERIA DNA BY PCR      4. Encounter for surveillance of vaginal ring hormonal contraceptive device  Z30.44 Etonogestrel-Ethinyl Estradiol 0.12-0.015 mg/24 hr Vaginal Ring        Plan:  Discussed above examination findings and what was done with patient.    Discussed patient complaints related  to above findings and answered questions. Encouraged patient to have routine yearly screening as appropriate (including mammogram and pap smear).  Counseled patient regarding monthly self-breast examination, multivitamins supplements, exercise and activity for general health maintenance.  Refills on NuvaRing E scribed to pharmacy on file.. Encouraged patient to call with results in 1-2 weeks. Will follow up in one year or sooner as needed.     Margarite Gouge, MD  12/05/2021, 14:12

## 2021-12-08 ENCOUNTER — Ambulatory Visit (INDEPENDENT_AMBULATORY_CARE_PROVIDER_SITE_OTHER): Payer: Self-pay | Admitting: PHYSICIAN ASSISTANT

## 2021-12-08 LAB — CYTOPATHOLOGY, GYN +/- HIGH RISK HPV

## 2021-12-20 ENCOUNTER — Other Ambulatory Visit (INDEPENDENT_AMBULATORY_CARE_PROVIDER_SITE_OTHER): Payer: Self-pay | Admitting: PHYSICIAN ASSISTANT

## 2021-12-20 DIAGNOSIS — Z3044 Encounter for surveillance of vaginal ring hormonal contraceptive device: Secondary | ICD-10-CM

## 2022-02-09 ENCOUNTER — Other Ambulatory Visit (HOSPITAL_BASED_OUTPATIENT_CLINIC_OR_DEPARTMENT_OTHER): Payer: Self-pay | Admitting: Family Medicine

## 2022-02-20 ENCOUNTER — Ambulatory Visit: Payer: Medicaid Other | Admitting: Allergy

## 2022-03-05 ENCOUNTER — Other Ambulatory Visit: Payer: Self-pay

## 2022-03-05 ENCOUNTER — Ambulatory Visit: Payer: Self-pay | Attending: OBSTETRICS/GYNECOLOGY | Admitting: OBSTETRICS/GYNECOLOGY

## 2022-03-05 DIAGNOSIS — R87610 Atypical squamous cells of undetermined significance on cytologic smear of cervix (ASC-US): Secondary | ICD-10-CM | POA: Insufficient documentation

## 2022-03-05 DIAGNOSIS — Z124 Encounter for screening for malignant neoplasm of cervix: Secondary | ICD-10-CM | POA: Insufficient documentation

## 2022-03-07 ENCOUNTER — Encounter (INDEPENDENT_AMBULATORY_CARE_PROVIDER_SITE_OTHER): Payer: Self-pay | Admitting: OBSTETRICS/GYNECOLOGY

## 2022-03-07 NOTE — Progress Notes (Signed)
OB/GYN, [address]  [place] [address]  Operated by [facility]     Name: Kelly House MRN:  [id number]   Date: 03/05/2022 Age: 21 y.o.     INDICATION:  Abnormal Pap smear showing atypical squamous cells of undetermined significance.  High-risk HPV done 2022 negative.    FINDINGS:   Colposcopy adequate and satisfactory   Entire squamo columnar junction seen  Acetowhite lesions not visualized at cervical or vaginal locations.  No mosaicism, punctations, or prominent vessels.     The patient was consented prior to the procedure.        A Time out was performed identifying the patient and the procedure, allergies and DOB. The patient, physician, and staff were all in agreement.  A speculum was placed and complete visualization of the cervix and vagina was undertaken. The transformation zone was visualized. Acetic Acid was placed on the cervix as well as the vagina. The green filter was utilized to view any lesions. Acetowhite areas were not seen as mentioned above.  Discussed above findings with patient.  Pap smear taken.  Assessment/plan:    1. Abnormal Pap smear showing atypical squamous cells of undetermined significance.  Adequate satisfactory colposcopy     Impression:  Abnormal Pap smear showing atypical squamous cells of undetermined significance.  Normal colposcopy.    Colposcopy was performed without difficulty and the patient tolerated the procedure well. Findings discussed with the patient. She will be called in 1-2 weeks to discuss results and further plan.    Margarite Gouge, MD

## 2022-03-08 LAB — CYTOPATHOLOGY, GYN +/- HIGH RISK HPV

## 2022-03-09 LAB — HUMAN PAPILLOMA VIRUS (HPV) BY PCR WITH HIGH RISK GENOTYPING (THINPREP)
HPV OTHER: POSITIVE — AB
HPV16 PCR: NEGATIVE
HPV18 PCR: NEGATIVE

## 2022-03-11 ENCOUNTER — Other Ambulatory Visit (HOSPITAL_BASED_OUTPATIENT_CLINIC_OR_DEPARTMENT_OTHER): Payer: Self-pay | Admitting: Family Medicine

## 2022-04-08 ENCOUNTER — Other Ambulatory Visit (HOSPITAL_BASED_OUTPATIENT_CLINIC_OR_DEPARTMENT_OTHER): Payer: Self-pay | Admitting: Family Medicine

## 2022-05-08 ENCOUNTER — Other Ambulatory Visit (HOSPITAL_BASED_OUTPATIENT_CLINIC_OR_DEPARTMENT_OTHER): Payer: Self-pay | Admitting: Family Medicine

## 2022-06-05 ENCOUNTER — Other Ambulatory Visit (HOSPITAL_BASED_OUTPATIENT_CLINIC_OR_DEPARTMENT_OTHER): Payer: Self-pay | Admitting: Family Medicine

## 2022-06-06 ENCOUNTER — Encounter (INDEPENDENT_AMBULATORY_CARE_PROVIDER_SITE_OTHER): Payer: Self-pay

## 2022-07-06 ENCOUNTER — Other Ambulatory Visit (HOSPITAL_BASED_OUTPATIENT_CLINIC_OR_DEPARTMENT_OTHER): Payer: Self-pay | Admitting: Family Medicine

## 2022-07-25 ENCOUNTER — Ambulatory Visit (INDEPENDENT_AMBULATORY_CARE_PROVIDER_SITE_OTHER): Payer: Self-pay | Admitting: Internal Medicine

## 2022-07-25 ENCOUNTER — Encounter: Payer: Self-pay | Admitting: Internal Medicine

## 2022-07-25 VITALS — BP 118/68 | HR 85 | Temp 98.2°F | Ht 66.0 in | Wt 170.5 lb

## 2022-07-25 DIAGNOSIS — K219 Gastro-esophageal reflux disease without esophagitis: Secondary | ICD-10-CM

## 2022-07-25 DIAGNOSIS — J4550 Severe persistent asthma, uncomplicated: Secondary | ICD-10-CM

## 2022-07-25 DIAGNOSIS — J302 Other seasonal allergic rhinitis: Secondary | ICD-10-CM

## 2022-07-25 LAB — POCT EXHALED NITRIC OXIDE: FeNO level (ppb): 8

## 2022-07-25 MED ORDER — DULERA 200-5 MCG/ACT IN AERO: 2.0000 | INHALATION_SPRAY | Freq: Two times a day (BID) | RESPIRATORY_TRACT | 5 refills | Status: AC

## 2022-07-25 NOTE — Progress Notes (Signed)
Female Masis    [id number]    March 08, 2001  Primary Care Physician:Sistasis, Lowell Guitar, MD  Referring Physician: Sanjuana Letters, FNP [address] [place],  [place] [address] Reason for Consultation: asthma Date of Consultation: 07/25/2022  Chief complaint:   Chief Complaint  Patient presents with   Consult    Hx of asthma      HPI: Katelyn Best is a 22 y.o. woman who presents for new patient evaluation for asthma. Was diagnosed with eosinophilic asthma at age 22.  History of very poorly controlled steroid dependent asthma.   Was placed on nucala initially and then was on dupixent for about 3 years and was lost to follow up and came off around 2022. Had a good response.  She is allergic to prednisone and gets a rash - she takes medrol dose pack instead. Has similar problems with advair.   Current Regimen: symbicort 160 2 puffs twice a day,  Asthma Triggers: weather and temperature changes, exercise, perfumes  Exacerbations in the last year: three times in the last 12 months.  History of hospitalization or intubation: PICU admission at age 22. Previously every 3-4 months hospitalized Allergy Testing: yes 2018 ige 15 GERD: yes on protonix.  Allergic Rhinitis: xyzal and singulair ACT:  Asthma Control Test ACT Total Score  07/25/2022  2:45 PM 19  09/17/2017  3:00 PM 14  07/23/2017 11:00 AM 17   FeNO: 8 ppb  Social history:  Occupation: has gone to Science writer school" through [employer] high school. Currently works as a Freight forwarder.   Exposures: lives with fiance Smoking history: never smoker. No passive smoke exposure  Social History   Occupational History   Occupation: full time Consulting civil engineer  Tobacco Use   Smoking status: Never   Smokeless tobacco: Never  Vaping Use   Vaping Use: Never used  Substance and Sexual Activity   Alcohol use: No   Drug use: No   Sexual activity: Never    Relevant family history:  Family History  Problem  Relation Age of Onset   Asthma Mother    Cancer Mother    Asthma Father    Diabetes Maternal Grandmother    Diabetes Maternal Grandfather    Hyperlipidemia Maternal Grandfather    Asthma Paternal Grandmother    Asthma Paternal Grandfather    Asthma Other     Past Medical History:  Diagnosis Date   ADHD (attention deficit hyperactivity disorder)    Anxiety    Asthma    Bipolar disorder (HCC)    Chronic migraine    CKD (chronic kidney disease) stage 2, GFR 60-89 ml/min    Depression    Strep pharyngitis     Past Surgical History:  Procedure Laterality Date   WISDOM TOOTH EXTRACTION  09/09/2017     Physical Exam: Blood pressure 118/68, pulse 85, temperature 98.2 F (36.8 C), temperature source Oral, height 5\' 6"  (1.676 m), weight 170 lb 8.6 oz (77.4 kg), SpO2 99 %. Gen:      No acute distress ENT:  cobblestoning present, mild thrush, no nasal polyps, mucus membranes moist Lungs:    No increased respiratory effort, symmetric chest wall excursion, clear to auscultation bilaterally, no wheezes or crackles CV:         Regular rate and rhythm; no murmurs, rubs, or gallops.  No pedal edema Abd:      + bowel sounds; soft, non-tender; no distension MSK: no acute synovitis of DIP or PIP joints,  no mechanics hands.  Skin:      Warm and dry; no rashes Neuro: normal speech, no focal facial asymmetry Psych: alert and oriented x3, normal mood and affect   Data Reviewed/Medical Decision Making:  Independent interpretation of tests: Imaging:  Review of patient's chest xray April 2022 images revealed no acute cardiopulmonary process. The patient's images have been independently reviewed by me.    PFTs: I have personally reviewed the patient's PFTs and severe airflow limitation, redemonstrated on spirometry October 2022.    Latest Ref Rng & Units 04/15/2015    9:40 AM 04/14/2015    1:02 PM  PFT Results  FVC-Pre L 2.43  0.80   FVC-Predicted Pre % 60  19   FVC-Post L 3.00     FVC-Predicted Post % 74    Pre FEV1/FVC % % 44  66   Post FEV1/FCV % % 68    FEV1-Pre L 1.07  0.53   FEV1-Predicted Pre % 30  15   FEV1-Post L 2.04      Labs: ige 15, mild dustmite sensitivity.  Lab Results  Component Value Date   NA 138 10/02/2018   K 4.4 10/02/2018   CO2 24 10/02/2018   GLUCOSE 87 10/02/2018   BUN 12 10/02/2018   CREATININE 1.36 (H) 10/02/2018   CALCIUM 9.7 10/02/2018   GFRNONAA 56 (L) 10/02/2018   Lab Results  Component Value Date   WBC 7.1 10/17/2018   HGB 12.4 10/17/2018   HCT 36.3 10/17/2018   MCV 91 10/17/2018   PLT 259 10/17/2018     Immunization status:  Immunization History  Administered Date(s) Administered   Dtap, Unspecified 07/18/1999, 09/26/1999, 05/31/2000, 12/06/2000, 06/30/2003   HIB, Unspecified 07/18/1999, 09/26/1999, 05/31/2000, 06/23/2001   HPV Quadrivalent 10/26/2010, 10/15/2011   Hep B, Unspecified 07/18/1999, 09/26/1999, 05/31/2000   Hepatitis A, Ped/Adol-2 Dose 09/25/2010, 10/09/2011   Hepatitis B, PED/ADOLESCENT 07/18/1999, 09/26/1999, 05/31/2000   Hpv-Unspecified 03/01/2009   Influenza, Seasonal, Injecte, Preservative Fre 02/15/2010   Influenza,inj,Quad PF,6+ Mos 12/29/2018   Influenza-Unspecified 03/01/2009, 12/16/2015   MMR 12/06/2000, 06/30/2003   Meningococcal B, OMV 10/01/2016, 09/24/2017   Meningococcal Conjugate 10/01/2016   Meningococcal Mcv4o 10/23/2010   Pneumococcal-Unspecified 12/16/2015   Polio, Unspecified 07/18/1999, 09/26/1999, 05/31/2000, 06/30/2003   Td 09/25/2010, 10/23/2010   Tdap 10/09/2011   Varicella 05/31/2000, 06/04/2006     I reviewed prior external note(s) from allergy and asthma  I reviewed the result(s) of the labs and imaging as noted above.   I have ordered feno   Assessment:  Severe persistent asthma, not well controlled Allergic rhinitis, not well controlled Gerd    Plan/Recommendations: Stop symbicort and switch to dulera 200 2 puffs twice a day, gargle after use. If  this is not helping may need to resume dupixent.  Continue albuterol inhaler.  Continue montelukast and xyzal.   Continue acid reflux medicine.  Resume flonase over the counter.  We discussed disease management and progression at length today regarding asthma   Return to Care: Return in about 3 months (around 10/25/2022).  Durel Salts, MD Pulmonary and Critical Care Medicine [facility] [phone]  CC: Sanjuana Letters, FNP

## 2022-07-25 NOTE — Patient Instructions (Addendum)
Please schedule follow up scheduled with myself in 3 months.  If my schedule is not open yet, we will contact you with a reminder closer to that time. Please call [phone] if you haven't heard from [place] a month before.   Please call me sooner if questions or issues with your breathing.   Stop symbicort and switch to dulera 200 2 puffs twice a day, gargle after use.  Continue albuterol inhaler.  Continue montelukast and xyzal.   Continue acid reflux medicine.  Resume flonase over the counter.  Flonase - 1 spray on each side of your nose twice a day for first week, then 1 spray on each side.   Instructions for use: If you also use a saline nasal spray or rinse, use that first. Position the head with the chin slightly tucked. Use the right hand to spray into the left nostril and the right hand to spray into the left nostril.   Point the bottle away from the septum of your nose (cartilage that divides the two sides of your nose).  Hold the nostril closed on the opposite side from where you will spray Spray once and gently sniff to pull the medicine into the higher parts of your nose.  Don't sniff too hard as the medicine will drain down the back of your throat instead. Repeat with a second spray on the same side if prescribed. Repeat on the other side of your nose.

## 2022-07-27 ENCOUNTER — Telehealth: Payer: Self-pay | Admitting: Internal Medicine

## 2022-07-27 NOTE — Telephone Encounter (Signed)
Katelyn Best mother states Elwin Sleight is too expensive. Would like something else called into pharmacy. Pharmacy is [facility]. Katelyn Best phone number is [phone].

## 2022-07-30 ENCOUNTER — Other Ambulatory Visit (HOSPITAL_COMMUNITY): Payer: Self-pay

## 2022-07-30 NOTE — Telephone Encounter (Signed)
Please have patient submit active insurance coverage, card on file is no longer active for pharmacy coverage as of 03/18/2022

## 2022-07-30 NOTE — Telephone Encounter (Signed)
Please run ticket on patients insurance to see what else is covered?   Please route to triage?

## 2022-07-30 NOTE — Telephone Encounter (Signed)
Called and spoke w/ pt told her that we need her updated insurance card to run a ticket on her insurance. She verbalized understanding, and states that she will do that and call our office back. NFN att.

## 2022-08-03 ENCOUNTER — Other Ambulatory Visit (HOSPITAL_BASED_OUTPATIENT_CLINIC_OR_DEPARTMENT_OTHER): Payer: Self-pay | Admitting: Family Medicine

## 2022-08-31 ENCOUNTER — Other Ambulatory Visit (HOSPITAL_BASED_OUTPATIENT_CLINIC_OR_DEPARTMENT_OTHER): Payer: Self-pay | Admitting: Family Medicine

## 2022-09-27 ENCOUNTER — Other Ambulatory Visit (HOSPITAL_BASED_OUTPATIENT_CLINIC_OR_DEPARTMENT_OTHER): Payer: Self-pay | Admitting: Family Medicine

## 2022-10-25 ENCOUNTER — Other Ambulatory Visit (HOSPITAL_BASED_OUTPATIENT_CLINIC_OR_DEPARTMENT_OTHER): Payer: Self-pay | Admitting: Family Medicine

## 2022-11-06 ENCOUNTER — Encounter (HOSPITAL_COMMUNITY): Payer: Self-pay | Admitting: Emergency Medicine

## 2022-11-06 ENCOUNTER — Other Ambulatory Visit: Payer: Self-pay

## 2022-11-06 ENCOUNTER — Emergency Department (HOSPITAL_COMMUNITY)
Admission: EM | Admit: 2022-11-06 | Discharge: 2022-11-07 | Disposition: A | Discharge status: Home / Self Care | Payer: 59 | Attending: Emergency Medicine | Admitting: Emergency Medicine

## 2022-11-06 DIAGNOSIS — S8001XA Contusion of right knee, initial encounter: Secondary | ICD-10-CM | POA: Insufficient documentation

## 2022-11-06 DIAGNOSIS — W01198A Fall on same level from slipping, tripping and stumbling with subsequent striking against other object, initial encounter: Secondary | ICD-10-CM | POA: Diagnosis not present

## 2022-11-06 DIAGNOSIS — S8391XA Sprain of unspecified site of right knee, initial encounter: Secondary | ICD-10-CM | POA: Insufficient documentation

## 2022-11-06 DIAGNOSIS — Y9302 Activity, running: Secondary | ICD-10-CM | POA: Insufficient documentation

## 2022-11-06 DIAGNOSIS — S8991XA Unspecified injury of right lower leg, initial encounter: Secondary | ICD-10-CM | POA: Diagnosis present

## 2022-11-06 NOTE — ED Triage Notes (Signed)
Patient at work running up the stairs and fell accidentally/ mechanically and hit R knee states she felt something move. This happened today. Ambulatory. Has prior knee injury to same side so wanting it reevaluated.

## 2022-11-07 ENCOUNTER — Emergency Department (HOSPITAL_COMMUNITY): Payer: 59

## 2022-11-07 MED ORDER — IBUPROFEN 800 MG PO TABS
800.0000 mg | ORAL_TABLET | Freq: Once | ORAL | Status: AC
  Administered 2022-11-07: 800 mg via ORAL
  Filled 2022-11-07: qty 1

## 2022-11-07 NOTE — ED Notes (Signed)
Patient ambulatory back to room with steady gait. Patient CAOx4, in NAD.

## 2022-11-07 NOTE — ED Provider Notes (Signed)
[place] EMERGENCY DEPARTMENT AT [facility] Provider Note   CSN: [id number] Arrival date & time: 11/06/22  2336     History  Chief Complaint  Patient presents with   Knee Injury    Katelyn Best is a 22 y.o. female.  22 year old female with right knee pain. Patient was running up the stairs at work tonight when she tripped up the step and fell forwards, striking her knees on the stairs.  Primarily complains of pain in the right knee, minor abrasion and bruising to the knee.  She is able to bear weight although with pain.  Reports prior meniscus tear, not repaired.  No other injuries, complaints, concerns.       Home Medications Prior to Admission medications   Medication Sig Start Date End Date Taking? Authorizing Provider  acetaminophen (TYLENOL) 500 MG tablet Take 500-1,000 mg by mouth every 6 (six) hours as needed for headache (pain/ menstrual cramps).    [provider]  albuterol (PROVENTIL) (2.5 MG/3ML) 0.083% nebulizer solution Take 3 mLs (2.5 mg total) by nebulization every 4 (four) hours as needed for wheezing or shortness of breath. 07/23/17   Marcelyn Bruins, MD  CONCERTA 27 MG CR tablet Take 27 mg by mouth every morning. 07/09/19   [provider]  DUPIXENT 300 MG/2ML SOPN INJECT 1 PEN UNDER THE SKIN EVERY OTHER WEEK Patient not taking: Reported on 07/25/2022 06/01/20   Marcelyn Bruins, MD  EPINEPHrine 0.3 mg/0.3 mL IJ SOAJ injection Use as directed for life threatening allergic reactions 02/26/17   Kozlow, Alvira Philips, MD  escitalopram (LEXAPRO) 20 MG tablet Take 20 mg by mouth at bedtime.    [provider]  LATUDA 60 MG TABS Take 60 mg by mouth daily.  12/21/18   [provider]  levocetirizine (XYZAL) 5 MG tablet TAKE ONE TABLET BY MOUTH EVERY DAY 01/31/21   Padgett, Pilar Grammes, MD  mometasone-formoterol [facility] 200-5 MCG/ACT AERO Inhale 2 puffs into the lungs in the morning and at bedtime. 07/25/22    Charlott Holler, MD  montelukast (SINGULAIR) 10 MG tablet Take one tablet once daily 06/06/21   Marcelyn Bruins, MD  Multiple Vitamin (MULTIVITAMIN) capsule Take by mouth.    [provider]  mupirocin ointment (BACTROBAN) 2 % SMARTSIG:1 Application Topical 2-3 Times Daily 06/05/21   [provider]  nortriptyline (PAMELOR) 10 MG capsule Take 3 capsules (30 mg total) by mouth at bedtime. Schedule appointment for additional refills. Patient not taking: Reported on 07/25/2022 12/29/19   Nita Sickle K, DO  Nutritional Supplements (NUTRITIONAL SUPPLEMENT PO) Take by mouth. [employer] Anxiety [facility] Stress Relief    [provider]  pantoprazole (PROTONIX) 40 MG tablet Take 40 mg by mouth daily. 10/05/18   [provider]  PROAIR HFA 108 (90 Base) MCG/ACT inhaler INHALE TWO PUFFS INTO THE LUNGS EVERY 4 HOURS AS NEEDED FOR WHEEZING/SOB 03/03/18   Kozlow, Alvira Philips, MD  SUMAtriptan (IMITREX) 100 MG tablet Take at 1 tablet onset of severe migraine.  May repeat in 2 hours if headache persists or recurs.  Limit to 2 tablets in 24 hr period. 10/24/18   Nita Sickle K, DO  venlafaxine XR (EFFEXOR-XR) 75 MG 24 hr capsule Take 75 mg by mouth daily. 05/15/20   [provider]      Allergies    Other, Advair diskus [fluticasone-salmeterol], Prednisone, and Qvar [beclomethasone]    Review of Systems   Review of Systems Negative except  as per HPI Physical Exam Updated Vital Signs BP 98/75 (BP Location: Right Arm)   Pulse 85   Temp 98.4 F (36.9 C) (Oral)   Resp 18   Ht 5\' 6"  (1.676 m)   Wt 77 kg   LMP 10/04/2022   SpO2 93%   BMI 27.40 kg/m  Physical Exam Vitals and nursing note reviewed.  Constitutional:      General: She is not in acute distress.    Appearance: She is well-developed. She is not diaphoretic.  HENT:     Head: Normocephalic and atraumatic.  Cardiovascular:     Pulses: Normal pulses.  Pulmonary:     Effort: Pulmonary effort is  normal.  Musculoskeletal:        General: Swelling and tenderness present. No deformity.     Right knee: Swelling and ecchymosis present. No deformity, effusion, bony tenderness or crepitus. Decreased range of motion. Tenderness present over the LCL. No MCL tenderness. Normal pulse.     Left knee: Ecchymosis present. No bony tenderness. Normal range of motion. No tenderness.       Legs:     Comments: Minor abrasion to anterior right knee, mild ecchymosis and swelling, no effusion.  Skin:    General: Skin is warm and dry.     Findings: No erythema or rash.  Neurological:     Mental Status: She is alert and oriented to person, place, and time.     Sensory: No sensory deficit.     Motor: No weakness.  Psychiatric:        Behavior: Behavior normal.     ED Results / Procedures / Treatments   Labs (all labs ordered are listed, but only abnormal results are displayed) Labs Reviewed - No data to display  EKG None  Radiology DG Knee Complete 4 Views Right  Result Date: 11/07/2022 CLINICAL DATA:  Knee pain EXAM: RIGHT KNEE - COMPLETE 4+ VIEW COMPARISON:  None Available. FINDINGS: No evidence of fracture, dislocation, or joint effusion. No evidence of arthropathy or other focal bone abnormality. Soft tissues are unremarkable. IMPRESSION: Negative. Electronically Signed   By: Helyn Numbers M.D.   On: 11/07/2022 00:32    Procedures Procedures    Medications Ordered in ED Medications  ibuprofen (ADVIL) tablet 800 mg (has no administration in time range)    ED Course/ Medical Decision Making/ A&P                                 Medical Decision Making Amount and/or Complexity of Data Reviewed Radiology: ordered.  Risk Prescription drug management.   22 year old female with right knee pain after fall as above.  Found to have mild bruising and swelling with abrasion to the right knee.  Limited flexion secondary to pain.  X-ray of the right knee as ordered for myself is negative  for acute bony abnormality.  Patient is provided with crutches to weight-bear as tolerated, recommend ice and elevate, can take Motrin and Tylenol.  Advised patient  as injury occurred at work, recommend discuss with her employer, may file a Worker's Comp.  Should follow-up with Worker's Comp. [employer] in 2 days for recheck.  If she chooses not to follow Worker's Comp., she is provided with referral to orthopedics for recheck in 1 week if not improving.        Final Clinical Impression(s) / ED Diagnoses Final diagnoses:  Sprain of right knee, unspecified ligament, initial  encounter  Contusion of right knee, initial encounter    Rx / DC Orders ED Discharge Orders     None         Alden Hipp 11/07/22 0224    Tilden Fossa, MD 11/07/22 [phone]

## 2022-11-07 NOTE — Discharge Instructions (Signed)
Recheck with your Worker's Comp. provider in 2 days if you do choose to go the [employer]. route.  Otherwise, follow-up with orthopedics in 1 week if not improving. Use crutches, weight-bear as tolerated. You can apply ice for 20 minutes at a time and elevate your leg to help with pain and swelling. Take Motrin and Tylenol as needed directed.

## 2022-11-18 ENCOUNTER — Other Ambulatory Visit (INDEPENDENT_AMBULATORY_CARE_PROVIDER_SITE_OTHER): Payer: Self-pay | Admitting: PHYSICIAN ASSISTANT

## 2022-11-18 DIAGNOSIS — Z3044 Encounter for surveillance of vaginal ring hormonal contraceptive device: Secondary | ICD-10-CM

## 2022-11-22 ENCOUNTER — Other Ambulatory Visit (HOSPITAL_BASED_OUTPATIENT_CLINIC_OR_DEPARTMENT_OTHER): Payer: Self-pay | Admitting: Family Medicine

## 2022-12-09 ENCOUNTER — Other Ambulatory Visit (HOSPITAL_BASED_OUTPATIENT_CLINIC_OR_DEPARTMENT_OTHER): Payer: Self-pay | Admitting: Family Medicine

## 2022-12-12 ENCOUNTER — Other Ambulatory Visit (INDEPENDENT_AMBULATORY_CARE_PROVIDER_SITE_OTHER): Payer: Self-pay | Admitting: PHYSICIAN ASSISTANT

## 2022-12-12 DIAGNOSIS — Z3044 Encounter for surveillance of vaginal ring hormonal contraceptive device: Secondary | ICD-10-CM

## 2023-01-03 ENCOUNTER — Encounter (INDEPENDENT_AMBULATORY_CARE_PROVIDER_SITE_OTHER): Payer: Self-pay

## 2023-01-04 ENCOUNTER — Other Ambulatory Visit (HOSPITAL_BASED_OUTPATIENT_CLINIC_OR_DEPARTMENT_OTHER): Payer: Self-pay | Admitting: Family Medicine

## 2023-01-14 ENCOUNTER — Other Ambulatory Visit (HOSPITAL_BASED_OUTPATIENT_CLINIC_OR_DEPARTMENT_OTHER): Payer: Self-pay | Admitting: Family Medicine

## 2023-01-14 MED ORDER — FLUDROCORTISONE 0.1 MG TABLET
0.1000 mg | ORAL_TABLET | Freq: Every morning | ORAL | 0 refills | Status: DC
Start: 2023-01-14 — End: 2023-04-15

## 2023-02-26 ENCOUNTER — Encounter (INDEPENDENT_AMBULATORY_CARE_PROVIDER_SITE_OTHER): Payer: Self-pay | Admitting: OBSTETRICS/GYNECOLOGY

## 2023-04-13 ENCOUNTER — Other Ambulatory Visit (HOSPITAL_BASED_OUTPATIENT_CLINIC_OR_DEPARTMENT_OTHER): Payer: Self-pay | Admitting: Family Medicine

## 2023-08-19 ENCOUNTER — Ambulatory Visit: Admitting: Physical Therapy

## 2023-09-06 ENCOUNTER — Other Ambulatory Visit: Payer: Self-pay

## 2023-09-06 ENCOUNTER — Ambulatory Visit: Attending: Pain Medicine | Admitting: Physical Therapy

## 2023-09-06 ENCOUNTER — Encounter: Payer: Self-pay | Admitting: Physical Therapy

## 2023-09-06 VITALS — BP 106/74 | HR 89

## 2023-09-06 DIAGNOSIS — M25512 Pain in left shoulder: Secondary | ICD-10-CM | POA: Insufficient documentation

## 2023-09-06 DIAGNOSIS — M546 Pain in thoracic spine: Secondary | ICD-10-CM | POA: Insufficient documentation

## 2023-09-06 DIAGNOSIS — M5459 Other low back pain: Secondary | ICD-10-CM | POA: Insufficient documentation

## 2023-09-06 DIAGNOSIS — R2681 Unsteadiness on feet: Secondary | ICD-10-CM | POA: Insufficient documentation

## 2023-09-06 DIAGNOSIS — R293 Abnormal posture: Secondary | ICD-10-CM | POA: Diagnosis present

## 2023-09-06 DIAGNOSIS — M25552 Pain in left hip: Secondary | ICD-10-CM | POA: Insufficient documentation

## 2023-09-06 DIAGNOSIS — M6281 Muscle weakness (generalized): Secondary | ICD-10-CM | POA: Insufficient documentation

## 2023-09-06 DIAGNOSIS — M25511 Pain in right shoulder: Secondary | ICD-10-CM | POA: Insufficient documentation

## 2023-09-06 DIAGNOSIS — G8929 Other chronic pain: Secondary | ICD-10-CM | POA: Insufficient documentation

## 2023-09-06 DIAGNOSIS — M25551 Pain in right hip: Secondary | ICD-10-CM | POA: Insufficient documentation

## 2023-09-06 NOTE — Therapy (Unsigned)
 OUTPATIENT PHYSICAL THERAPY NEURO EVALUATION   Patient Name: Katelyn Best MRN: [id number] DOB:2000-12-21, 23 y.o., female Today's Date: 09/06/2023   PCP: Page Boast, NP ([place]) REFERRING PROVIDER: Trudi Fus, MD  END OF SESSION:  PT End of Session - 09/06/23 1114     Visit Number 1    Number of Visits 9   8 + eval   Authorization Type [employer]    PT Start Time 1105    PT Stop Time 1201    PT Time Calculation (min) 56 min    Equipment Utilized During Treatment Gait belt    Activity Tolerance Patient tolerated treatment well    Behavior During Therapy WFL for tasks assessed/performed          Past Medical History:  Diagnosis Date   ADHD (attention deficit hyperactivity disorder)    Anxiety    Asthma    Bipolar disorder (HCC)    Chronic migraine    CKD (chronic kidney disease) stage 2, GFR 60-89 ml/min    Depression    Strep pharyngitis    Past Surgical History:  Procedure Laterality Date   WISDOM TOOTH EXTRACTION  09/09/2017   Patient Active Problem List   Diagnosis Date Noted   Chronic migraine 03/14/2018   Migraine without aura and without status migrainosus, not intractable 03/14/2018   Adjustment disorder with anxious mood     ONSET DATE: since 3rd grade  REFERRING DIAG: G89.4 (ICD-10-CM) - Chronic pain syndrome  THERAPY DIAG:  Abnormal posture  Other low back pain  Pain in thoracic spine  Muscle weakness (generalized)  Unsteadiness on feet  Chronic pain of both shoulders  Pain of both hip joints  Rationale for Evaluation and Treatment: Rehabilitation  SUBJECTIVE:                                                                                                                                                                                             SUBJECTIVE STATEMENT: Pt reports having done PT throughout middle school and high school for this issue.  She has had 4 surgeries on her feet - bunionectomies, between [age]  years old.  She has had pain in all major joints since 3rd grade.  In April 2025 she noticed she was fainting, she is unsure if she had before.  She feels she is hypermobile (she is scheduled for EDS assessment w/ rheumatology in August) and tilt table testing with cardiology in August as well.  She notes she was also told of concern for FND in February.  She states she is seeing a new psychiatrist as she was being prescribed medications  that patient feels she did not need so she switched - reports psychiatrist was trying to diagnose her with PTSD, borderline personality disorder, and GAD.  She further states she has a headache everyday that never goes to 0/10.  She was falling a lot, but this has decreased as it warms up outside. Pt accompanied by: significant other (fiance - Josh)  PERTINENT HISTORY: Chronic migraine, ADHD, anxiety, asthma, Bipolar disorder, CKD stage 2, depression  PAIN:  Are you having pain? Yes: NPRS scale: 5 (right knee), 6 (neck to lumbar spine) Pain location: back and right knee Pain description: tightness Aggravating factors: anything Relieving factors: nothing  PRECAUTIONS: Fall  RED FLAGS: Bowel or bladder incontinence: Yes: reports infrequent but B/B incontinence does occur   WEIGHT BEARING RESTRICTIONS: No  FALLS: Has patient fallen in last 6 months? Yes. Number of falls at least 12 per week, pt reports she sometimes falls due to syncope/near syncope, sometimes due to imbalance  LIVING ENVIRONMENT: Lives with: lives with their partner Lives in: Mobile home Stairs: Yes: External: 3 steps; on right going up Has following equipment at home: Wheelchair (manual)  PLOF: Requires assistive device for independence, Needs assistance with ADLs, Needs assistance with homemaking, Needs assistance with gait, Needs assistance with transfers, and she sometimes uses HHA for walking at home, she does not walk in community - uses manual wheelchair  PATIENT GOALS: Figuring  out what's causing the chronic pain, having someone listen to me.  OBJECTIVE:  Note: Objective measures were completed at Evaluation unless otherwise noted.  DIAGNOSTIC FINDINGS: No recent relevant imaging.  COGNITION: Overall cognitive status: Within functional limits for tasks assessed   SENSATION: Light touch: WFL  COORDINATION: LE RAMS:  WFL Bilateral Heel-to-shin:  unable to due reporting tightness and pain around thigh, she is symmetrically limited and reports it's weird, I can't do this, but I can still do the splits.  EDEMA:  None noted in BLE  MUSCLE TONE: None noted in BLE  POSTURE: rounded shoulders, forward head, and posterior pelvic tilt  LOWER EXTREMITY ROM/MMT:     Active  Right Eval Left Eval  Hip flexion 4/5 4+  Hip extension    Hip abduction 4/5 4/5  Hip adduction    Hip internal rotation    Hip external rotation    Knee flexion 4/5 4+/5  Knee extension 4-/5 4/5  Ankle dorsiflexion 5/5 5/5  Ankle plantarflexion    Ankle inversion    Ankle eversion     (Blank rows = not tested)   BED MOBILITY:  Not tested - sleeps on the couch  TRANSFERS: Sit to stand: Modified independence  Assistive device utilized: None     Stand to sit: Modified independence  Assistive device utilized: None     Chair to chair: CGA  Assistive device utilized: None     -using features of chair to STS and transfer  RAMP:  Not tested  CURB:  Not tested  STAIRS: Not tested GAIT: Findings: Gait Characteristics: step to pattern, decreased arm swing- Right, decreased arm swing- Left, decreased step length- Left, decreased stance time- Right, decreased stride length, and decreased hip/knee flexion- Right, Distance walked: 8 ft, Assistive device utilized:None, Level of assistance: CGA, and Comments: antalgic gait, pt reports mild dizziness that resolves in sitting <30 seconds  FUNCTIONAL TESTS:  Timed up and go (TUG): TBA 2 minute walk test: TBA 10 meter walk test:  TBA  PATIENT SURVEYS:  Modified Oswestry:  MODIFIED OSWESTRY DISABILITY SCALE  Date: 09/06/2023  Score  Pain intensity 5 =  Pain medication has no effect on my pain.  2. Personal care (washing, dressing, etc.) 3 =  I need help, but I am able to manage most of my personal care.  3. Lifting 4 = I can lift only very light weights  4. Walking 3 =  Pain prevents me from walking more than  mile.  5. Sitting 2 =  Pain prevents me from sitting more than 1 hour.  6. Standing 4 =  Pain prevents me from standing more than 10 minutes.  7. Sleeping 4 =  Even when I take pain medication, I sleep less than 2 hour  8. Social Life 0 = My social life is normal and does not increase my pain.  9. Traveling 1 =  I can travel anywhere, but it increases my pain.  10. Employment/ Homemaking 2 = I can perform most of my homemaking/job duties, but pain prevents me from performing more physically stressful activities (eg, lifting, vacuuming).  Total ***/50   Interpretation of scores: Score Category Description  0-20% Minimal Disability The patient can cope with most living activities. Usually no treatment is indicated apart from advice on lifting, sitting and exercise  21-40% Moderate Disability The patient experiences more pain and difficulty with sitting, lifting and standing. Travel and social life are more difficult and they may be disabled from work. Personal care, sexual activity and sleeping are not grossly affected, and the patient can usually be managed by conservative means  41-60% Severe Disability Pain remains the main problem in this group, but activities of daily living are affected. These patients require a detailed investigation  61-80% Crippled Back pain impinges on all aspects of the patient's life. Positive intervention is required  81-100% Bed-bound  These patients are either bed-bound or exaggerating their symptoms  Frances Ingles, et al. Surgery versus conservative management of  stable thoracolumbar fracture: the PRESTO feasibility RCT. [place] ([place]): [employer]; 2021 Nov. [facility] Technology Assessment, No. 25.62.) Appendix 3, Oswestry Disability Index category descriptors. Available from: [employer]el[employer]cz  Minimally Clinically Important Difference (MCID) = 12.8%                                                                                                                              TREATMENT DATE: 09/06/2023    PATIENT EDUCATION: Education details: PT POC, assessments used and to be used, and goals to be set.  Discussed slow transitions, diaphragmatic breathing, and LE/UE movement prior to movement and in initial standing to manage dizziness if related to BP.  Answered questions related to dysautonomia, hypermobility, and chronicity of deficits and goals of care and mobility at home. Person educated: Patient and fiance Education method: Explanation, Demonstration, and Verbal cues Education comprehension: verbalized understanding and needs further education  HOME EXERCISE PROGRAM: To be established.  GOALS: Goals reviewed with patient? {yes/no:20286}  SHORT TERM GOALS: Target date: ***  *** Baseline:  Goal status: INITIAL  2.  *** Baseline:  Goal status: INITIAL  3.  *** Baseline:  Goal status: INITIAL  4.  *** Baseline:  Goal status: INITIAL  5.  *** Baseline:  Goal status: INITIAL  6.  *** Baseline:  Goal status: INITIAL  LONG TERM GOALS: Target date: ***  *** Baseline:  Goal status: INITIAL  2.  *** Baseline:  Goal status: INITIAL  3.  *** Baseline:  Goal status: INITIAL  4.  *** Baseline:  Goal status: INITIAL  5.  *** Baseline:  Goal status: INITIAL  6.  *** Baseline:  Goal status: INITIAL  ASSESSMENT:  CLINICAL IMPRESSION: Patient is a *** y.o. *** who was seen today for physical therapy evaluation and treatment for ***.   OBJECTIVE IMPAIRMENTS:  {opptimpairments:25111}.   ACTIVITY LIMITATIONS: {activitylimitations:27494}  PARTICIPATION LIMITATIONS: {participationrestrictions:25113}  PERSONAL FACTORS: {Personal factors:25162} are also affecting patient's functional outcome.   REHAB POTENTIAL: {rehabpotential:25112}  CLINICAL DECISION MAKING: {clinical decision making:25114}  EVALUATION COMPLEXITY: {Evaluation complexity:25115}  PLAN:  PT FREQUENCY: 1x/week  PT DURATION: 8 weeks  PLANNED INTERVENTIONS: {rehab planned interventions:25118::97110-Therapeutic exercises,97530- Therapeutic [phone]- Neuromuscular re-education,97535- Self QION,62952- Manual therapy}  PLAN FOR NEXT SESSION: ***TUG, , - set goals as appropriate.  May need to assess orthostatic vitals.  SciFit vs arm bike for endurance.  Initiate low level (probably sitting) core, calf and LE focused strengthening HEP > progress to balance.  Aquatic therapy previously did not help.  Earlean Glaze, PT, DPT 09/06/2023, 12:13 PM

## 2023-09-13 ENCOUNTER — Ambulatory Visit: Admitting: Physical Therapy

## 2023-09-18 ENCOUNTER — Ambulatory Visit: Admitting: Physical Therapy

## 2023-09-25 ENCOUNTER — Encounter: Payer: Self-pay | Admitting: Physical Therapy

## 2023-09-25 ENCOUNTER — Ambulatory Visit: Attending: Pain Medicine | Admitting: Physical Therapy

## 2023-09-25 VITALS — BP 114/73 | HR 125

## 2023-09-25 DIAGNOSIS — M25552 Pain in left hip: Secondary | ICD-10-CM | POA: Diagnosis present

## 2023-09-25 DIAGNOSIS — M25551 Pain in right hip: Secondary | ICD-10-CM | POA: Insufficient documentation

## 2023-09-25 DIAGNOSIS — R293 Abnormal posture: Secondary | ICD-10-CM | POA: Insufficient documentation

## 2023-09-25 DIAGNOSIS — M546 Pain in thoracic spine: Secondary | ICD-10-CM | POA: Diagnosis present

## 2023-09-25 DIAGNOSIS — M25512 Pain in left shoulder: Secondary | ICD-10-CM | POA: Insufficient documentation

## 2023-09-25 DIAGNOSIS — M5459 Other low back pain: Secondary | ICD-10-CM | POA: Insufficient documentation

## 2023-09-25 DIAGNOSIS — M25511 Pain in right shoulder: Secondary | ICD-10-CM | POA: Diagnosis present

## 2023-09-25 DIAGNOSIS — M6281 Muscle weakness (generalized): Secondary | ICD-10-CM | POA: Insufficient documentation

## 2023-09-25 DIAGNOSIS — G8929 Other chronic pain: Secondary | ICD-10-CM | POA: Diagnosis present

## 2023-09-25 DIAGNOSIS — R2681 Unsteadiness on feet: Secondary | ICD-10-CM | POA: Diagnosis present

## 2023-09-25 NOTE — Therapy (Signed)
 OUTPATIENT PHYSICAL THERAPY NEURO TREATMENT   Patient Name: Katelyn Best MRN: [id number] DOB:08-Oct-2000, 23 y.o., female Today's Date: 09/25/2023   PCP: Tobias Domino, NP ([place]) REFERRING PROVIDER: Constantino Bong, MD  END OF SESSION:  PT End of Session - 09/25/23 1420     Visit Number 2    Number of Visits 9   8 + eval   Date for PT Re-Evaluation 11/08/23   pushed out due to potential scheduling delay   Authorization Type [employer]    PT Start Time 1406    PT Stop Time 1447    PT Time Calculation (min) 41 min    Equipment Utilized During Treatment Gait belt    Activity Tolerance Patient tolerated treatment well    Behavior During Therapy WFL for tasks assessed/performed         Past Medical History:  Diagnosis Date   ADHD (attention deficit hyperactivity disorder)    Anxiety    Asthma    Bipolar disorder (HCC)    Chronic migraine    CKD (chronic kidney disease) stage 2, GFR 60-89 ml/min    Depression    Strep pharyngitis    Past Surgical History:  Procedure Laterality Date   WISDOM TOOTH EXTRACTION  09/09/2017   Patient Active Problem List   Diagnosis Date Noted   Chronic migraine 03/14/2018   Migraine without aura and without status migrainosus, not intractable 03/14/2018   Adjustment disorder with anxious mood     ONSET DATE: since 3rd grade  REFERRING DIAG: G89.4 (ICD-10-CM) - Chronic pain syndrome  THERAPY DIAG:  Abnormal posture  Other low back pain  Pain in thoracic spine  Muscle weakness (generalized)  Unsteadiness on feet  Chronic pain of both shoulders  Pain of both hip joints  Rationale for Evaluation and Treatment: Rehabilitation  SUBJECTIVE:                                                                                                                                                                                             SUBJECTIVE STATEMENT: Pt reports feeling more elevated HR the past couple weeks.  She is having  RLE and low back pain today - thinks she slept poorly.  She is unable to see rheumatology for EDS and hypermobility diagnosis and inquires about an alternative provider that can assess. She still has tilt table testing July 29. She recently fell letting her dogs out as her legs just collapsed when she stood.  She is seeing a headache specialist as [facility] in August about her ongoing HA. Pt accompanied by: significant other (fiance - Josh)  PERTINENT HISTORY: Chronic  migraine, ADHD, anxiety, asthma, Bipolar disorder, CKD stage 2, depression  PAIN:  Are you having pain? Yes: NPRS scale: 5 Pain location: LBP and right thigh Pain description: knot, muscle ache Aggravating factors: most movement of the leg Relieving factors: nothing  PRECAUTIONS: Fall  RED FLAGS: Bowel or bladder incontinence: Yes: reports infrequent but B/B incontinence does occur   WEIGHT BEARING RESTRICTIONS: No  FALLS: Has patient fallen in last 6 months? Yes. Number of falls at least 12 per week, pt reports she sometimes falls due to syncope/near syncope, sometimes due to imbalance  LIVING ENVIRONMENT: Lives with: lives with their partner Lives in: Mobile home Stairs: Yes: External: 3 steps; on right going up Has following equipment at home: Wheelchair (manual)  PLOF: Requires assistive device for independence, Needs assistance with ADLs, Needs assistance with homemaking, Needs assistance with gait, Needs assistance with transfers, and she sometimes uses HHA for walking at home, she does not walk in community - uses manual wheelchair  PATIENT GOALS: Figuring out what's causing the chronic pain, having someone listen to me.  OBJECTIVE:  Note: Objective measures were completed at Evaluation unless otherwise noted.  DIAGNOSTIC FINDINGS: No recent relevant imaging.  COGNITION: Overall cognitive status: Within functional limits for tasks assessed   SENSATION: Light touch: WFL  COORDINATION: LE RAMS:   WFL Bilateral Heel-to-shin:  unable to due to reporting tightness and pain around thigh, she is symmetrically limited and reports it's weird, I can't do this, but I can still do the splits.  EDEMA:  None noted in BLE  MUSCLE TONE: None noted in BLE  POSTURE: rounded shoulders, forward head, and posterior pelvic tilt  LOWER EXTREMITY ROM/MMT:     Active  Right Eval Left Eval  Hip flexion 4/5 4+  Hip extension    Hip abduction 4/5 4/5  Hip adduction    Hip internal rotation    Hip external rotation    Knee flexion 4/5 4+/5  Knee extension 4-/5 4/5  Ankle dorsiflexion 5/5 5/5  Ankle plantarflexion    Ankle inversion    Ankle eversion     (Blank rows = not tested)   BED MOBILITY:  Not tested - sleeps on the couch, other option is mattress on floor which is harder because it is lower  TRANSFERS: Sit to stand: Modified independence  Assistive device utilized: None     Stand to sit: Modified independence  Assistive device utilized: None     Chair to chair: CGA  Assistive device utilized: None     -using features of chair to STS and transfer  RAMP:  Not tested  CURB:  Not tested  STAIRS: Not tested GAIT: Findings: Gait Characteristics: step to pattern, decreased arm swing- Right, decreased arm swing- Left, decreased step length- Left, decreased stance time- Right, decreased stride length, and decreased hip/knee flexion- Right, Distance walked: 8 ft, Assistive device utilized:None, Level of assistance: CGA, and Comments: antalgic gait, pt reports mild dizziness that resolves in sitting <30 seconds  FUNCTIONAL TESTS:  Timed up and go (TUG): TBA 2 minute walk test: TBA 10 meter walk test: TBA  PATIENT SURVEYS:  Modified Oswestry:  MODIFIED OSWESTRY DISABILITY SCALE  Date: 09/06/2023 Score  Pain intensity 5 =  Pain medication has no effect on my pain.  2. Personal care (washing, dressing, etc.) 3 =  I need help, but I am able to manage most of my personal care.  3.  Lifting 4 = I can lift only very light weights  4.  Walking 3 =  Pain prevents me from walking more than  mile.  5. Sitting 2 =  Pain prevents me from sitting more than 1 hour.  6. Standing 4 =  Pain prevents me from standing more than 10 minutes.  7. Sleeping 4 =  Even when I take pain medication, I sleep less than 2 hour  8. Social Life 0 = My social life is normal and does not increase my pain.  9. Traveling 1 =  I can travel anywhere, but it increases my pain.  10. Employment/ Homemaking 2 = I can perform most of my homemaking/job duties, but pain prevents me from performing more physically stressful activities (eg, lifting, vacuuming).  Total 28/50   Interpretation of scores: Score Category Description  0-20% Minimal Disability The patient can cope with most living activities. Usually no treatment is indicated apart from advice on lifting, sitting and exercise  21-40% Moderate Disability The patient experiences more pain and difficulty with sitting, lifting and standing. Travel and social life are more difficult and they may be disabled from work. Personal care, sexual activity and sleeping are not grossly affected, and the patient can usually be managed by conservative means  41-60% Severe Disability Pain remains the main problem in this group, but activities of daily living are affected. These patients require a detailed investigation  61-80% Crippled Back pain impinges on all aspects of the patient's life. Positive intervention is required  81-100% Bed-bound  These patients are either bed-bound or exaggerating their symptoms  Bluford FORBES Zoe DELENA Karon DELENA, et al. Surgery versus conservative management of stable thoracolumbar fracture: the PRESTO feasibility RCT. [place] ([place]): [employer]; 2021 Nov. [facility] Technology Assessment, No. 25.62.) Appendix 3, Oswestry Disability Index category descriptors. Available from: [employer]cz  Minimally  Clinically Important Difference (MCID) = 12.8%                                                                                                                              TREATMENT DATE: 09/25/2023  -Discussed having conversation with cardiologist about HR spikes, post-viral relationship with current symptoms as she reports severe viral illness in December, and provided alternative rheumatology option she can contact about assessment.  Discussed general negative feedback loop of lack of general movement > weakness > increased fatigue as she inquires about why she may feel jelly-like in her legs all the time. - :  1ft HHA and CGA w/ +2 w/c follow, pt has repetitive R foot inversion w/o LOB and variable lateral waver w/ delayed righting - :  0.25 m/sec OR 0.83 ft/sec HHA (left hand) and CGA w/ +2 w/c follow, one brief pause near end of distance -TUG:  42.12 sec CGA and HHA, single posterior LOB w/ minA to recover, increased time to adjust in immediate standing  PATIENT EDUCATION: Education details: Initial HEP, discussed slow return to regular exercise and if in gym environment please use supervision and focus on low impact seated  activity first - use assistance with transfers and supervision as needed and work into exercise on days where least symptomatic to limit fall risk.  See above for further. Person educated: Patient and fiance Education method: Explanation, Demonstration, and Verbal cues Education comprehension: verbalized understanding and needs further education  HOME EXERCISE PROGRAM: STS at counter w/ supervision - slow transitions Seated - overhead arm press > forward press > lateral press > eccentric seated heel raises/toe raises > slow marching w/ holds  GOALS: Goals reviewed with patient? Yes  SHORT TERM GOALS: Target date: 10/04/2023  Pt will be independent and compliant with introductory strength and balance focused HEP in order to maintain functional progress and  improve mobility. Baseline:  To be established. Goal status: INITIAL  2.  Pt will demonstrate TUG of </=32.12 seconds in order to decrease risk of falls and improve functional mobility using LRAD. Baseline: 42.12 sec CGA and HHA (7/9) Goal status: INITIAL  3.  Pt will ambulate>/=50 feet on to demonstrate improved endurance for functional tasks in home and community. Baseline: 23ft HHA and CGA w/ +2 w/c follow (7/9) Goal status: INITIAL  4.  Pt will demonstrate a gait speed of >/=1.03 feet/sec in order to decrease risk for falls. Baseline: 0.83 ft/sec HHA (left hand) and CGA w/ +2 w/c follow (7/9) Goal status: INITIAL  LONG TERM GOALS: Target date: 11/01/2023  Pt will be independent and compliant with advanced program to include: functional transfers, BLE strengthening, core stability, static balance and endurance in order to maintain functional progress and improve mobility. Baseline: To be established. Goal status: INITIAL  2.  Pt will demonstrate TUG of </=22.12 seconds in order to decrease risk of falls and improve functional mobility using LRAD. Baseline: 42.12 sec CGA and HHA (7/9) Goal status: INITIAL  3.  Pt will ambulate>/=75 feet on to demonstrate improved endurance for functional tasks in home and community. Baseline: 20ft HHA and CGA w/ +2 w/c follow (7/9) Goal status: INITIAL  4.  Pt will demonstrate a gait speed of >/=1.23 feet/sec in order to decrease risk for falls. Baseline: 0.83 ft/sec HHA (left hand) and CGA w/ +2 w/c follow (7/9) Goal status: INITIAL  5.  Modified Oswestry score to improve to </=43.2% in order to demonstrate a clinically significant improvement in functional status and pain management. Baseline: 56% Goal status: INITIAL  ASSESSMENT:  CLINICAL IMPRESSION: Patient seen for initial follow-up to evaluation without significant status change.  Outcome assessments completed not captured at evaluation and all indicative of elevated fall  risk.  She was given low level HEP to initiate increased seated activity vs ongoing sedentary mobility.  She continues to benefit from skilled PT to return her to improved upright mobility as able and decrease symptoms with increased activity challenge.  Continue per POC.  OBJECTIVE IMPAIRMENTS: Abnormal gait, cardiopulmonary status limiting activity, decreased activity tolerance, decreased balance, decreased coordination, decreased knowledge of condition, decreased knowledge of use of DME, decreased strength, dizziness, improper body mechanics, postural dysfunction, and pain.   ACTIVITY LIMITATIONS: carrying, lifting, bending, standing, squatting, stairs, transfers, bed mobility, bathing, toileting, dressing, reach over head, hygiene/grooming, and locomotion level  PARTICIPATION LIMITATIONS: meal prep, cleaning, laundry, driving, shopping, community activity, and occupation  PERSONAL FACTORS: Behavior pattern, Fitness, Past/current experiences, Time since onset of injury/illness/exacerbation, and 3+ comorbidities: psychological factors, chronic migraines, and ongoing workup for cardiovascular factors are also affecting patient's functional outcome.   REHAB POTENTIAL: Good  CLINICAL DECISION MAKING: Evolving/moderate complexity  EVALUATION COMPLEXITY: Moderate  PLAN:  PT FREQUENCY: 1x/week  PT DURATION: 8 weeks  PLANNED INTERVENTIONS: 97164- PT Re-evaluation, 97750- Physical Performance Testing, 97110-Therapeutic exercises, 97530- Therapeutic activity, [id number]- Neuromuscular re-education, 97535- Self Care, [id number]- Manual therapy, [id number]- Gait training, [phone]- Electrical stimulation (manual), [phone] (1-2 muscles), 20561 (3+ muscles)- Dry Needling, Patient/Family education, Balance training, Stair training, Taping, Vestibular training, DME instructions, Wheelchair mobility training, Cryotherapy, and Moist heat  PLAN FOR NEXT SESSION: L BP.  SciFit vs arm bike for endurance.  Initiate formal printout  of low level core, calf and LE focused strengthening HEP > progress to balance/standing tolerance.  Rollator?  Aquatic therapy previously did not help.  Daved KATHEE Bull, PT, DPT 09/25/2023, 3:02 PM

## 2023-09-26 ENCOUNTER — Ambulatory Visit: Admitting: Physical Therapy

## 2023-10-04 ENCOUNTER — Ambulatory Visit: Admitting: Physical Therapy

## 2023-10-04 ENCOUNTER — Encounter: Payer: Self-pay | Admitting: Physical Therapy

## 2023-10-04 VITALS — BP 103/68 | HR 90

## 2023-10-04 DIAGNOSIS — R293 Abnormal posture: Secondary | ICD-10-CM | POA: Diagnosis not present

## 2023-10-04 DIAGNOSIS — R2681 Unsteadiness on feet: Secondary | ICD-10-CM

## 2023-10-04 DIAGNOSIS — M6281 Muscle weakness (generalized): Secondary | ICD-10-CM

## 2023-10-04 DIAGNOSIS — M5459 Other low back pain: Secondary | ICD-10-CM

## 2023-10-04 DIAGNOSIS — M546 Pain in thoracic spine: Secondary | ICD-10-CM

## 2023-10-04 DIAGNOSIS — G8929 Other chronic pain: Secondary | ICD-10-CM

## 2023-10-04 DIAGNOSIS — M25552 Pain in left hip: Secondary | ICD-10-CM

## 2023-10-04 NOTE — Therapy (Signed)
 OUTPATIENT PHYSICAL THERAPY NEURO TREATMENT   Patient Name: Katelyn Best MRN: [id number] DOB:10-28-2000, 23 y.o., female Today's Date: 10/04/2023   PCP: Tobias Domino, NP ([place]) REFERRING PROVIDER: Constantino Bong, MD  END OF SESSION:  PT End of Session - 10/04/23 1113     Visit Number 3    Number of Visits 9   8 + eval   Date for PT Re-Evaluation 11/08/23   pushed out due to potential scheduling delay   Authorization Type [employer]    PT Start Time 1103    PT Stop Time 1158    PT Time Calculation (min) 55 min    Equipment Utilized During Treatment Gait belt    Activity Tolerance Patient tolerated treatment well    Behavior During Therapy WFL for tasks assessed/performed         Past Medical History:  Diagnosis Date   ADHD (attention deficit hyperactivity disorder)    Anxiety    Asthma    Bipolar disorder (HCC)    Chronic migraine    CKD (chronic kidney disease) stage 2, GFR 60-89 ml/min    Depression    Strep pharyngitis    Past Surgical History:  Procedure Laterality Date   WISDOM TOOTH EXTRACTION  09/09/2017   Patient Active Problem List   Diagnosis Date Noted   Chronic migraine 03/14/2018   Migraine without aura and without status migrainosus, not intractable 03/14/2018   Adjustment disorder with anxious mood     ONSET DATE: since 3rd grade  REFERRING DIAG: G89.4 (ICD-10-CM) - Chronic pain syndrome  THERAPY DIAG:  Abnormal posture  Other low back pain  Pain in thoracic spine  Muscle weakness (generalized)  Unsteadiness on feet  Chronic pain of both shoulders  Pain of both hip joints  Rationale for Evaluation and Treatment: Rehabilitation  SUBJECTIVE:                                                                                                                                                                                             SUBJECTIVE STATEMENT: Pt reports feeling more elevated HR upon standing with some chest pain in  standing.  She has a broken back molar and is trying to find a dentist to see her without insurance to have this pulled. Pt accompanied by: significant other (fiance - Josh)  PERTINENT HISTORY: Chronic migraine, ADHD, anxiety, asthma, Bipolar disorder, CKD stage 2, depression  PAIN:  Are you having pain? Yes: NPRS scale: 5 Pain location: LBP and right thigh Pain description: knot, muscle ache Aggravating factors: most movement of the leg Relieving factors: nothing  PRECAUTIONS: Fall  RED  FLAGS: Bowel or bladder incontinence: Yes: reports infrequent but B/B incontinence does occur   WEIGHT BEARING RESTRICTIONS: No  FALLS: Has patient fallen in last 6 months? Yes. Number of falls at least 12 per week, pt reports she sometimes falls due to syncope/near syncope, sometimes due to imbalance  LIVING ENVIRONMENT: Lives with: lives with their partner Lives in: Mobile home Stairs: Yes: External: 3 steps; on right going up Has following equipment at home: Wheelchair (manual)  PLOF: Requires assistive device for independence, Needs assistance with ADLs, Needs assistance with homemaking, Needs assistance with gait, Needs assistance with transfers, and she sometimes uses HHA for walking at home, she does not walk in community - uses manual wheelchair  PATIENT GOALS: Figuring out what's causing the chronic pain, having someone listen to me.  OBJECTIVE:  Note: Objective measures were completed at Evaluation unless otherwise noted.  DIAGNOSTIC FINDINGS: No recent relevant imaging.  COGNITION: Overall cognitive status: Within functional limits for tasks assessed   SENSATION: Light touch: WFL  COORDINATION: LE RAMS:  WFL Bilateral Heel-to-shin:  unable to due to reporting tightness and pain around thigh, she is symmetrically limited and reports it's weird, I can't do this, but I can still do the splits.  EDEMA:  None noted in BLE  MUSCLE TONE: None noted in BLE  POSTURE: rounded  shoulders, forward head, and posterior pelvic tilt  LOWER EXTREMITY ROM/MMT:     Active  Right Eval Left Eval  Hip flexion 4/5 4+  Hip extension    Hip abduction 4/5 4/5  Hip adduction    Hip internal rotation    Hip external rotation    Knee flexion 4/5 4+/5  Knee extension 4-/5 4/5  Ankle dorsiflexion 5/5 5/5  Ankle plantarflexion    Ankle inversion    Ankle eversion     (Blank rows = not tested)   BED MOBILITY:  Not tested - sleeps on the couch, other option is mattress on floor which is harder because it is lower  TRANSFERS: Sit to stand: Modified independence  Assistive device utilized: None     Stand to sit: Modified independence  Assistive device utilized: None     Chair to chair: CGA  Assistive device utilized: None     -using features of chair to STS and transfer  RAMP:  Not tested  CURB:  Not tested  STAIRS: Not tested GAIT: Findings: Gait Characteristics: step to pattern, decreased arm swing- Right, decreased arm swing- Left, decreased step length- Left, decreased stance time- Right, decreased stride length, and decreased hip/knee flexion- Right, Distance walked: 8 ft, Assistive device utilized:None, Level of assistance: CGA, and Comments: antalgic gait, pt reports mild dizziness that resolves in sitting <30 seconds  FUNCTIONAL TESTS:  Timed up and go (TUG): TBA 2 minute walk test: TBA 10 meter walk test: TBA  PATIENT SURVEYS:  Modified Oswestry:  MODIFIED OSWESTRY DISABILITY SCALE  Date: 09/06/2023 Score  Pain intensity 5 =  Pain medication has no effect on my pain.  2. Personal care (washing, dressing, etc.) 3 =  I need help, but I am able to manage most of my personal care.  3. Lifting 4 = I can lift only very light weights  4. Walking 3 =  Pain prevents me from walking more than  mile.  5. Sitting 2 =  Pain prevents me from sitting more than 1 hour.  6. Standing 4 =  Pain prevents me from standing more than 10 minutes.  7. Sleeping 4 =  Even when  I take pain medication, I sleep less than 2 hour  8. Social Life 0 = My social life is normal and does not increase my pain.  9. Traveling 1 =  I can travel anywhere, but it increases my pain.  10. Employment/ Homemaking 2 = I can perform most of my homemaking/job duties, but pain prevents me from performing more physically stressful activities (eg, lifting, vacuuming).  Total 28/50   Interpretation of scores: Score Category Description  0-20% Minimal Disability The patient can cope with most living activities. Usually no treatment is indicated apart from advice on lifting, sitting and exercise  21-40% Moderate Disability The patient experiences more pain and difficulty with sitting, lifting and standing. Travel and social life are more difficult and they may be disabled from work. Personal care, sexual activity and sleeping are not grossly affected, and the patient can usually be managed by conservative means  41-60% Severe Disability Pain remains the main problem in this group, but activities of daily living are affected. These patients require a detailed investigation  61-80% Crippled Back pain impinges on all aspects of the patient's life. Positive intervention is required  81-100% Bed-bound  These patients are either bed-bound or exaggerating their symptoms  Bluford FORBES Zoe DELENA Karon DELENA, et al. Surgery versus conservative management of stable thoracolumbar fracture: the PRESTO feasibility RCT. [place] ([place]): [employer]; 2021 Nov. [facility] Technology Assessment, No. 25.62.) Appendix 3, Oswestry Disability Index category descriptors. Available from: [employer]cz  Minimally Clinically Important Difference (MCID) = 12.8%                                                                                                                              TREATMENT DATE: 10/04/2023  -Ongoing cardiac concerns w/ elevated HR and HR variation (contributors like  general inactivity), logging HR, SpO2 and general symptoms when feeling unwell.  Further discussion of purpose of rheumatology as pt remains frustrated about lack of diagnosis and rheumatology referral being closed, she remains concerned about hypermobility and chronic pain. Informed her of checking for in-network providers with her insurance that she can pursue as she continues to inquire about potential diagnoses contributing to her symptoms - PT deferred to PCP and other specialist MDs.  She inquires about repeated breaks contributing to chronic pain and PT encouraged her to continue dialogue with pain management and provided education on anatomy, arthritic/bony changes and general movement for pain response and regulation.  General pain science education. -SciFit x8 minutes w/ BUE/BLE progressing to level 3.0 for global strength, endurance, and activity tolerance; mild dyspnea but pt maintains conversation throughout. -Seated calf raises x20 -Seated D1/D2 w/3.3 lb wt ball x15 each direction -Discussed being as self-sufficient with her mobility in the manual wheelchair as possible.  Discussed expected HR increase with activity and grading activity at home and with potential return to gym, she has access to gym membership through insurance as long as she meets 2  of 5 rewards criteria which is should meet with upcoming physical in August per report.  Her eligibility kicks in 8 weeks following this.  PT encouraged her to look into the gym she would be able to access and assess the equipment for safe use with supervision from fiance (suggested using seated stepper, light upper body weights/bands, and standing to ballet style bars for body weight tasks).  PATIENT EDUCATION: Education details: Discussed PT concerns for chest pains on standing and to continue monitoring BP/HR at home - keep a log for cardiologist appt 1st week of August (Can track on phone vs on paper as her dog has destroyed prior log of  symptoms).  Continue HEP.  See above for further. Person educated: Patient and fiance Education method: Explanation, Demonstration, and Verbal cues Education comprehension: verbalized understanding and needs further education  HOME EXERCISE PROGRAM: STS at counter w/ supervision - slow transitions Seated - overhead arm press > forward press > lateral press > eccentric seated heel raises/toe raises > slow marching w/ holds  GOALS: Goals reviewed with patient? Yes  SHORT TERM GOALS: Target date: 10/04/2023  Pt will be independent and compliant with introductory strength and balance focused HEP in order to maintain functional progress and improve mobility. Baseline:  To be established. Goal status: INITIAL  2.  Pt will demonstrate TUG of </=32.12 seconds in order to decrease risk of falls and improve functional mobility using LRAD. Baseline: 42.12 sec CGA and HHA (7/9) Goal status: INITIAL  3.  Pt will ambulate>/=50 feet on to demonstrate improved endurance for functional tasks in home and community. Baseline: 58ft HHA and CGA w/ +2 w/c follow (7/9) Goal status: INITIAL  4.  Pt will demonstrate a gait speed of >/=1.03 feet/sec in order to decrease risk for falls. Baseline: 0.83 ft/sec HHA (left hand) and CGA w/ +2 w/c follow (7/9) Goal status: INITIAL  LONG TERM GOALS: Target date: 11/01/2023  Pt will be independent and compliant with advanced program to include: functional transfers, BLE strengthening, core stability, static balance and endurance in order to maintain functional progress and improve mobility. Baseline: To be established. Goal status: INITIAL  2.  Pt will demonstrate TUG of </=22.12 seconds in order to decrease risk of falls and improve functional mobility using LRAD. Baseline: 42.12 sec CGA and HHA (7/9) Goal status: INITIAL  3.  Pt will ambulate>/=75 feet on to demonstrate improved endurance for functional tasks in home and community. Baseline: 65ft HHA  and CGA w/ +2 w/c follow (7/9) Goal status: INITIAL  4.  Pt will demonstrate a gait speed of >/=1.23 feet/sec in order to decrease risk for falls. Baseline: 0.83 ft/sec HHA (left hand) and CGA w/ +2 w/c follow (7/9) Goal status: INITIAL  5.  Modified Oswestry score to improve to </=43.2% in order to demonstrate a clinically significant improvement in functional status and pain management. Baseline: 56% Goal status: INITIAL  ASSESSMENT:  CLINICAL IMPRESSION: Majority of session spent providing ongoing education and encouragement regarding symptoms.  Pt continues to be concerned about a lack of diagnosis and this process was deferred to upcoming providers who can provider further clarity on specific systems and associated symptoms that PT cannot.  Time spent discussing promoting exercise tolerance with self-sufficiency at wheelchair level and gradual return to light exercise in gym setting with fiance.  She continues to benefit from skilled PT to promote walking tolerance and pain management to improve functional outcomes.  Continue per POC.  OBJECTIVE IMPAIRMENTS: Abnormal gait,  cardiopulmonary status limiting activity, decreased activity tolerance, decreased balance, decreased coordination, decreased knowledge of condition, decreased knowledge of use of DME, decreased strength, dizziness, improper body mechanics, postural dysfunction, and pain.   ACTIVITY LIMITATIONS: carrying, lifting, bending, standing, squatting, stairs, transfers, bed mobility, bathing, toileting, dressing, reach over head, hygiene/grooming, and locomotion level  PARTICIPATION LIMITATIONS: meal prep, cleaning, laundry, driving, shopping, community activity, and occupation  PERSONAL FACTORS: Behavior pattern, Fitness, Past/current experiences, Time since onset of injury/illness/exacerbation, and 3+ comorbidities: psychological factors, chronic migraines, and ongoing workup for cardiovascular factors are also affecting  patient's functional outcome.   REHAB POTENTIAL: Good  CLINICAL DECISION MAKING: Evolving/moderate complexity  EVALUATION COMPLEXITY: Moderate  PLAN:  PT FREQUENCY: 1x/week  PT DURATION: 8 weeks  PLANNED INTERVENTIONS: [id number]- PT Re-evaluation, 97750- Physical Performance Testing, 97110-Therapeutic exercises, 97530- Therapeutic activity, [id number]- Neuromuscular re-education, 97535- Self Care, [id number]- Manual therapy, [id number]- Gait training, [phone]- Electrical stimulation (manual), [phone] (1-2 muscles), [id number] (3+ muscles)- Dry Needling, Patient/Family education, Balance training, Stair training, Taping, Vestibular training, DME instructions, Wheelchair mobility training, Cryotherapy, and Moist heat  PLAN FOR NEXT SESSION: L BP.  SciFit vs arm bike for endurance.  Initiate formal printout of low level core, calf and LE focused strengthening HEP > progress to balance/standing tolerance.  Rollator?  Aquatic therapy previously did not help.  Daved KATHEE Bull, PT, DPT 10/04/2023, 1:17 PM

## 2023-10-09 ENCOUNTER — Ambulatory Visit: Admitting: Physical Therapy

## 2023-10-11 ENCOUNTER — Ambulatory Visit: Admitting: Physical Therapy

## 2023-10-18 ENCOUNTER — Encounter: Payer: Self-pay | Admitting: Physical Therapy

## 2023-10-18 ENCOUNTER — Ambulatory Visit: Attending: Pain Medicine | Admitting: Physical Therapy

## 2023-10-18 VITALS — BP 106/69 | HR 74

## 2023-10-18 DIAGNOSIS — R2681 Unsteadiness on feet: Secondary | ICD-10-CM | POA: Diagnosis present

## 2023-10-18 DIAGNOSIS — R293 Abnormal posture: Secondary | ICD-10-CM | POA: Diagnosis present

## 2023-10-18 DIAGNOSIS — M6281 Muscle weakness (generalized): Secondary | ICD-10-CM | POA: Diagnosis present

## 2023-10-18 DIAGNOSIS — M25512 Pain in left shoulder: Secondary | ICD-10-CM | POA: Diagnosis present

## 2023-10-18 DIAGNOSIS — M5459 Other low back pain: Secondary | ICD-10-CM | POA: Diagnosis present

## 2023-10-18 DIAGNOSIS — M25551 Pain in right hip: Secondary | ICD-10-CM | POA: Insufficient documentation

## 2023-10-18 DIAGNOSIS — G8929 Other chronic pain: Secondary | ICD-10-CM | POA: Diagnosis present

## 2023-10-18 DIAGNOSIS — M25511 Pain in right shoulder: Secondary | ICD-10-CM | POA: Insufficient documentation

## 2023-10-18 DIAGNOSIS — M25552 Pain in left hip: Secondary | ICD-10-CM | POA: Insufficient documentation

## 2023-10-18 DIAGNOSIS — M546 Pain in thoracic spine: Secondary | ICD-10-CM | POA: Diagnosis present

## 2023-10-18 NOTE — Therapy (Signed)
 OUTPATIENT PHYSICAL THERAPY NEURO TREATMENT   Patient Name: Katelyn Best MRN: [id number] DOB:07-Jul-2000, 23 y.o., female Today's Date: 10/18/2023   PCP: Tobias Domino, NP ([place]) REFERRING PROVIDER: Constantino Bong, MD  END OF SESSION:  PT End of Session - 10/18/23 1108     Visit Number 4    Number of Visits 9   8 + eval   Date for PT Re-Evaluation 11/08/23   pushed out due to potential scheduling delay   Authorization Type [employer]    PT Start Time 1103    PT Stop Time 1149    PT Time Calculation (min) 46 min    Equipment Utilized During Treatment Gait belt    Activity Tolerance Patient tolerated treatment well    Behavior During Therapy WFL for tasks assessed/performed         Past Medical History:  Diagnosis Date   ADHD (attention deficit hyperactivity disorder)    Anxiety    Asthma    Bipolar disorder (HCC)    Chronic migraine    CKD (chronic kidney disease) stage 2, GFR 60-89 ml/min    Depression    Strep pharyngitis    Past Surgical History:  Procedure Laterality Date   WISDOM TOOTH EXTRACTION  09/09/2017   Patient Active Problem List   Diagnosis Date Noted   Chronic migraine 03/14/2018   Migraine without aura and without status migrainosus, not intractable 03/14/2018   Adjustment disorder with anxious mood     ONSET DATE: since 3rd grade  REFERRING DIAG: G89.4 (ICD-10-CM) - Chronic pain syndrome  THERAPY DIAG:  Abnormal posture  Other low back pain  Pain in thoracic spine  Muscle weakness (generalized)  Unsteadiness on feet  Rationale for Evaluation and Treatment: Rehabilitation  SUBJECTIVE:                                                                                                                                                                                             SUBJECTIVE STATEMENT: Pt reports she is on a new asthma medication.  She had her tilt table test and has had a persistent headache since then. Pt accompanied  by: significant other (fiance - Josh)  PERTINENT HISTORY: Chronic migraine, ADHD, anxiety, asthma, Bipolar disorder, CKD stage 2, depression  PAIN:  Are you having pain? Yes: NPRS scale: 5 Pain location: LBP and right thigh Pain description: knot, muscle ache Aggravating factors: most movement of the leg Relieving factors: nothing  PRECAUTIONS: Fall  RED FLAGS: Bowel or bladder incontinence: Yes: reports infrequent but B/B incontinence does occur   WEIGHT BEARING RESTRICTIONS: No  FALLS: Has patient fallen in  last 6 months? Yes. Number of falls at least 12 per week, pt reports she sometimes falls due to syncope/near syncope, sometimes due to imbalance  LIVING ENVIRONMENT: Lives with: lives with their partner Lives in: Mobile home Stairs: Yes: External: 3 steps; on right going up Has following equipment at home: Wheelchair (manual)  PLOF: Requires assistive device for independence, Needs assistance with ADLs, Needs assistance with homemaking, Needs assistance with gait, Needs assistance with transfers, and she sometimes uses HHA for walking at home, she does not walk in community - uses manual wheelchair  PATIENT GOALS: Figuring out what's causing the chronic pain, having someone listen to me.  OBJECTIVE:  Note: Objective measures were completed at Evaluation unless otherwise noted.  DIAGNOSTIC FINDINGS: No recent relevant imaging.  COGNITION: Overall cognitive status: Within functional limits for tasks assessed   SENSATION: Light touch: WFL  COORDINATION: LE RAMS:  WFL Bilateral Heel-to-shin:  unable to due to reporting tightness and pain around thigh, she is symmetrically limited and reports it's weird, I can't do this, but I can still do the splits.  EDEMA:  None noted in BLE  MUSCLE TONE: None noted in BLE  POSTURE: rounded shoulders, forward head, and posterior pelvic tilt  LOWER EXTREMITY ROM/MMT:     Active  Right Eval Left Eval  Hip flexion 4/5 4+   Hip extension    Hip abduction 4/5 4/5  Hip adduction    Hip internal rotation    Hip external rotation    Knee flexion 4/5 4+/5  Knee extension 4-/5 4/5  Ankle dorsiflexion 5/5 5/5  Ankle plantarflexion    Ankle inversion    Ankle eversion     (Blank rows = not tested)   BED MOBILITY:  Not tested - sleeps on the couch, other option is mattress on floor which is harder because it is lower  TRANSFERS: Sit to stand: Modified independence  Assistive device utilized: None     Stand to sit: Modified independence  Assistive device utilized: None     Chair to chair: CGA  Assistive device utilized: None     -using features of chair to STS and transfer  RAMP:  Not tested  CURB:  Not tested  STAIRS: Not tested GAIT: Findings: Gait Characteristics: step to pattern, decreased arm swing- Right, decreased arm swing- Left, decreased step length- Left, decreased stance time- Right, decreased stride length, and decreased hip/knee flexion- Right, Distance walked: 8 ft, Assistive device utilized:None, Level of assistance: CGA, and Comments: antalgic gait, pt reports mild dizziness that resolves in sitting <30 seconds  FUNCTIONAL TESTS:  Timed up and go (TUG): TBA 2 minute walk test: TBA 10 meter walk test: TBA  PATIENT SURVEYS:  Modified Oswestry:  MODIFIED OSWESTRY DISABILITY SCALE  Date: 09/06/2023 Score  Pain intensity 5 =  Pain medication has no effect on my pain.  2. Personal care (washing, dressing, etc.) 3 =  I need help, but I am able to manage most of my personal care.  3. Lifting 4 = I can lift only very light weights  4. Walking 3 =  Pain prevents me from walking more than  mile.  5. Sitting 2 =  Pain prevents me from sitting more than 1 hour.  6. Standing 4 =  Pain prevents me from standing more than 10 minutes.  7. Sleeping 4 =  Even when I take pain medication, I sleep less than 2 hour  8. Social Life 0 = My social life is normal and  does not increase my pain.  9.  Traveling 1 =  I can travel anywhere, but it increases my pain.  10. Employment/ Homemaking 2 = I can perform most of my homemaking/job duties, but pain prevents me from performing more physically stressful activities (eg, lifting, vacuuming).  Total 28/50   Interpretation of scores: Score Category Description  0-20% Minimal Disability The patient can cope with most living activities. Usually no treatment is indicated apart from advice on lifting, sitting and exercise  21-40% Moderate Disability The patient experiences more pain and difficulty with sitting, lifting and standing. Travel and social life are more difficult and they may be disabled from work. Personal care, sexual activity and sleeping are not grossly affected, and the patient can usually be managed by conservative means  41-60% Severe Disability Pain remains the main problem in this group, but activities of daily living are affected. These patients require a detailed investigation  61-80% Crippled Back pain impinges on all aspects of the patient's life. Positive intervention is required  81-100% Bed-bound  These patients are either bed-bound or exaggerating their symptoms  Bluford FORBES Zoe DELENA Karon DELENA, et al. Surgery versus conservative management of stable thoracolumbar fracture: the PRESTO feasibility RCT. [place] ([place]): [employer]; 2021 Nov. [facility] Technology Assessment, No. 25.62.) Appendix 3, Oswestry Disability Index category descriptors. Available from: FindJewel[employer].cz  Minimally Clinically Important Difference (MCID) = 12.8%                                                                                                                              TREATMENT DATE: 10/18/2023  L BP in sitting prior to interventions: Today's Vitals   10/18/23 1106  BP: 106/69  Pulse: 74   -SciFit x8 minutes multi-peaks up to level 6.0 for cardiac endurance, BP management, and global  strengthening. -Supine bridges x10 -Supine march x10 each side -Side-lying clamshell x15 on right > repeated w/ red band x15 on left -Seated march w/ red band x20 -Resisted LAQ x10 each LE -Seated heel toe raises x10 each direction  PATIENT EDUCATION: Education details: Reiterated recommendations from cardiologist tilt table testing.  Discussed talking to PCP about nutrition consult/insurance benefits or more affordable options to supplement her salt intake as recommended as she reports her food stamps have been cut and they have trouble affording some of her necessities. Continue HEP w/ additions and modifications today.  Recommending monitoring BP if able to obtain BP cuff in future. Person educated: Patient and fiance Education method: Explanation, Demonstration, and Verbal cues Education comprehension: verbalized understanding and needs further education  HOME EXERCISE PROGRAM: STS at counter w/ supervision - slow transitions Seated - overhead arm press > forward press > lateral press  Access Code: [email or web address] URL: [email or web address] Date: 10/18/2023 Prepared by: Daved Bull  Exercises - Supine Bridge  - 1 x daily - 4-5 x weekly - 1-2 sets - 10 reps - Supine March  - 1 x daily - 4-5  x weekly - 1-2 sets - 10 reps - Clamshell with Resistance  - 1 x daily - 4-5 x weekly - 1-2 sets - 10-15 reps - Seated March With Resistance at Feet  - 1 x daily - 4-5 x weekly - 1-2 sets - 20 reps - Sitting Knee Extension with Resistance  - 1 x daily - 4-5 x weekly - 1-2 sets - 10 reps - Seated Heel Toe Raises  - 1 x daily - 4-5 x weekly - 2-3 sets - 10 reps  GOALS: Goals reviewed with patient? Yes  SHORT TERM GOALS: Target date: 10/04/2023  Pt will be independent and compliant with introductory strength and balance focused HEP in order to maintain functional progress and improve mobility. Baseline:  To be established. Goal status: INITIAL  2.  Pt will demonstrate TUG of  </=32.12 seconds in order to decrease risk of falls and improve functional mobility using LRAD. Baseline: 42.12 sec CGA and HHA (7/9) Goal status: INITIAL  3.  Pt will ambulate>/=50 feet on to demonstrate improved endurance for functional tasks in home and community. Baseline: 55ft HHA and CGA w/ +2 w/c follow (7/9) Goal status: INITIAL  4.  Pt will demonstrate a gait speed of >/=1.03 feet/sec in order to decrease risk for falls. Baseline: 0.83 ft/sec HHA (left hand) and CGA w/ +2 w/c follow (7/9) Goal status: INITIAL  LONG TERM GOALS: Target date: 11/01/2023  Pt will be independent and compliant with advanced program to include: functional transfers, BLE strengthening, core stability, static balance and endurance in order to maintain functional progress and improve mobility. Baseline: To be established. Goal status: INITIAL  2.  Pt will demonstrate TUG of </=22.12 seconds in order to decrease risk of falls and improve functional mobility using LRAD. Baseline: 42.12 sec CGA and HHA (7/9) Goal status: INITIAL  3.  Pt will ambulate>/=75 feet on to demonstrate improved endurance for functional tasks in home and community. Baseline: 36ft HHA and CGA w/ +2 w/c follow (7/9) Goal status: INITIAL  4.  Pt will demonstrate a gait speed of >/=1.23 feet/sec in order to decrease risk for falls. Baseline: 0.83 ft/sec HHA (left hand) and CGA w/ +2 w/c follow (7/9) Goal status: INITIAL  5.  Modified Oswestry score to improve to </=43.2% in order to demonstrate a clinically significant improvement in functional status and pain management. Baseline: 56% Goal status: INITIAL  ASSESSMENT:  CLINICAL IMPRESSION: Focus of skilled PT on establishing a formal HEP to address core and glute weakness.  She continues to benefit from skilled PT to improve activity tolerance, monitor BP fluctuation with activity, and establish a safe home routine that will help her manage her physical symptoms and  improve her upright mobility.  Continue per POC.  OBJECTIVE IMPAIRMENTS: Abnormal gait, cardiopulmonary status limiting activity, decreased activity tolerance, decreased balance, decreased coordination, decreased knowledge of condition, decreased knowledge of use of DME, decreased strength, dizziness, improper body mechanics, postural dysfunction, and pain.   ACTIVITY LIMITATIONS: carrying, lifting, bending, standing, squatting, stairs, transfers, bed mobility, bathing, toileting, dressing, reach over head, hygiene/grooming, and locomotion level  PARTICIPATION LIMITATIONS: meal prep, cleaning, laundry, driving, shopping, community activity, and occupation  PERSONAL FACTORS: Behavior pattern, Fitness, Past/current experiences, Time since onset of injury/illness/exacerbation, and 3+ comorbidities: psychological factors, chronic migraines, and ongoing workup for cardiovascular factors are also affecting patient's functional outcome.   REHAB POTENTIAL: Good  CLINICAL DECISION MAKING: Evolving/moderate complexity  EVALUATION COMPLEXITY: Moderate  PLAN:  PT FREQUENCY: 1x/week  PT  DURATION: 8 weeks  PLANNED INTERVENTIONS: [id number]- PT Re-evaluation, 97750- Physical Performance Testing, 97110-Therapeutic exercises, 97530- Therapeutic activity, [id number]- Neuromuscular re-education, 97535- Self Care, [id number]- Manual therapy, [id number]- Gait training, [phone]- Electrical stimulation (manual), [phone] (1-2 muscles), [id number] (3+ muscles)- Dry Needling, Patient/Family education, Balance training, Stair training, Taping, Vestibular training, DME instructions, Wheelchair mobility training, Cryotherapy, and Moist heat  PLAN FOR NEXT SESSION: L BP.  SciFit vs arm bike for endurance.  Modify HEP for low level core, calf and LE focused strengthening HEP > progress to balance/standing tolerance.  Rollator?  Aquatic therapy previously did not help.  Daved KATHEE Bull, PT, DPT 10/18/2023, 12:29 PM

## 2023-10-18 NOTE — Patient Instructions (Signed)
 Access Code: [email or web address] URL: [email or web address] Date: 10/18/2023 Prepared by: Daved Bull  Exercises - Supine Bridge  - 1 x daily - 4-5 x weekly - 1-2 sets - 10 reps - Supine March  - 1 x daily - 4-5 x weekly - 1-2 sets - 10 reps - Clamshell with Resistance  - 1 x daily - 4-5 x weekly - 1-2 sets - 10-15 reps - Seated March With Resistance at Feet  - 1 x daily - 4-5 x weekly - 1-2 sets - 20 reps - Sitting Knee Extension with Resistance  - 1 x daily - 4-5 x weekly - 1-2 sets - 10 reps - Seated Heel Toe Raises  - 1 x daily - 4-5 x weekly - 2-3 sets - 10 reps

## 2023-10-25 ENCOUNTER — Encounter: Payer: Self-pay | Admitting: Physical Therapy

## 2023-10-25 ENCOUNTER — Ambulatory Visit: Admitting: Physical Therapy

## 2023-10-25 VITALS — BP 102/67 | HR 74

## 2023-10-25 DIAGNOSIS — G8929 Other chronic pain: Secondary | ICD-10-CM

## 2023-10-25 DIAGNOSIS — M25551 Pain in right hip: Secondary | ICD-10-CM

## 2023-10-25 DIAGNOSIS — R293 Abnormal posture: Secondary | ICD-10-CM | POA: Diagnosis not present

## 2023-10-25 DIAGNOSIS — M6281 Muscle weakness (generalized): Secondary | ICD-10-CM

## 2023-10-25 DIAGNOSIS — R2681 Unsteadiness on feet: Secondary | ICD-10-CM

## 2023-10-25 DIAGNOSIS — M5459 Other low back pain: Secondary | ICD-10-CM

## 2023-10-25 DIAGNOSIS — M546 Pain in thoracic spine: Secondary | ICD-10-CM

## 2023-10-25 NOTE — Therapy (Signed)
 OUTPATIENT PHYSICAL THERAPY NEURO TREATMENT   Patient Name: Katelyn Best MRN: [id number] DOB:[birth year], 23 y.o., female Today's Date: 10/25/2023   PCP: Tobias Domino, NP ([place]) REFERRING PROVIDER: Constantino Bong, MD  END OF SESSION:  PT End of Session - 10/25/23 1106     Visit Number 5    Number of Visits 9   8 + eval   Date for PT Re-Evaluation 11/08/23   pushed out due to potential scheduling delay   Authorization Type [employer]    PT Start Time 1100    PT Stop Time 1145    PT Time Calculation (min) 45 min    Equipment Utilized During Treatment Gait belt    Activity Tolerance Patient tolerated treatment well;No increased pain;Patient limited by fatigue    Behavior During Therapy [facility] for tasks assessed/performed         Past Medical History:  Diagnosis Date   ADHD (attention deficit hyperactivity disorder)    Anxiety    Asthma    Bipolar disorder (HCC)    Chronic migraine    CKD (chronic kidney disease) stage 2, GFR 60-89 ml/min    Depression    Strep pharyngitis    Past Surgical History:  Procedure Laterality Date   WISDOM TOOTH EXTRACTION  09/09/2017   Patient Active Problem List   Diagnosis Date Noted   Chronic migraine 03/14/2018   Migraine without aura and without status migrainosus, not intractable 03/14/2018   Adjustment disorder with anxious mood     ONSET DATE: since 3rd grade  REFERRING DIAG: G89.4 (ICD-10-CM) - Chronic pain syndrome  THERAPY DIAG:  Abnormal posture  Other low back pain  Pain in thoracic spine  Muscle weakness (generalized)  Unsteadiness on feet  Chronic pain of both shoulders  Pain of both hip joints  Rationale for Evaluation and Treatment: Rehabilitation  SUBJECTIVE:                                                                                                                                                                                             SUBJECTIVE STATEMENT: Pt denies recent falls or  episodes of syncope.  She reports only sleeping from 8am to 930 am this morning because of recent stress and pain in her neck.  She endorses headaches that start at her temple radiating to the back of her head and down the midline of her neck. Pt accompanied by: significant other (fiance - Josh)  PERTINENT HISTORY: Chronic migraine, ADHD, anxiety, asthma, Bipolar disorder, CKD stage 2, depression  PAIN:  Are you having pain? Yes: NPRS scale: 6 Pain location: head and neck Pain  description: knot, muscle ache Aggravating factors: most movement of the leg Relieving factors: nothing  PRECAUTIONS: Fall  RED FLAGS: Bowel or bladder incontinence: Yes: reports infrequent but B/B incontinence does occur   WEIGHT BEARING RESTRICTIONS: No  FALLS: Has patient fallen in last 6 months? Yes. Number of falls at least 12 per week, pt reports she sometimes falls due to syncope/near syncope, sometimes due to imbalance  LIVING ENVIRONMENT: Lives with: lives with their partner Lives in: Mobile home Stairs: Yes: External: 3 steps; on right going up Has following equipment at home: Wheelchair (manual)  PLOF: Requires assistive device for independence, Needs assistance with ADLs, Needs assistance with homemaking, Needs assistance with gait, Needs assistance with transfers, and she sometimes uses HHA for walking at home, she does not walk in community - uses manual wheelchair  PATIENT GOALS: Figuring out what's causing the chronic pain, having someone listen to me.  OBJECTIVE:  Note: Objective measures were completed at Evaluation unless otherwise noted.  DIAGNOSTIC FINDINGS: No recent relevant imaging.  COGNITION: Overall cognitive status: Within functional limits for tasks assessed   SENSATION: Light touch: WFL  COORDINATION: LE RAMS:  WFL Bilateral Heel-to-shin:  unable to due to reporting tightness and pain around thigh, she is symmetrically limited and reports it's weird, I can't do  this, but I can still do the splits.  EDEMA:  None noted in BLE  MUSCLE TONE: None noted in BLE  POSTURE: rounded shoulders, forward head, and posterior pelvic tilt  LOWER EXTREMITY ROM/MMT:     Active  Right Eval Left Eval  Hip flexion 4/5 4+  Hip extension    Hip abduction 4/5 4/5  Hip adduction    Hip internal rotation    Hip external rotation    Knee flexion 4/5 4+/5  Knee extension 4-/5 4/5  Ankle dorsiflexion 5/5 5/5  Ankle plantarflexion    Ankle inversion    Ankle eversion     (Blank rows = not tested)   BED MOBILITY:  Not tested - sleeps on the couch, other option is mattress on floor which is harder because it is lower  TRANSFERS: Sit to stand: Modified independence  Assistive device utilized: None     Stand to sit: Modified independence  Assistive device utilized: None     Chair to chair: CGA  Assistive device utilized: None     -using features of chair to STS and transfer  RAMP:  Not tested  CURB:  Not tested  STAIRS: Not tested GAIT: Findings: Gait Characteristics: step to pattern, decreased arm swing- Right, decreased arm swing- Left, decreased step length- Left, decreased stance time- Right, decreased stride length, and decreased hip/knee flexion- Right, Distance walked: 8 ft, Assistive device utilized:None, Level of assistance: CGA, and Comments: antalgic gait, pt reports mild dizziness that resolves in sitting <30 seconds  FUNCTIONAL TESTS:  Timed up and go (TUG): TBA 2 minute walk test: TBA 10 meter walk test: TBA  PATIENT SURVEYS:  Modified Oswestry:  MODIFIED OSWESTRY DISABILITY SCALE  Date: 09/06/2023 Score  Pain intensity 5 =  Pain medication has no effect on my pain.  2. Personal care (washing, dressing, etc.) 3 =  I need help, but I am able to manage most of my personal care.  3. Lifting 4 = I can lift only very light weights  4. Walking 3 =  Pain prevents me from walking more than  mile.  5. Sitting 2 =  Pain prevents me from  sitting more than 1 hour.  6. Standing 4 =  Pain prevents me from standing more than 10 minutes.  7. Sleeping 4 =  Even when I take pain medication, I sleep less than 2 hour  8. Social Life 0 = My social life is normal and does not increase my pain.  9. Traveling 1 =  I can travel anywhere, but it increases my pain.  10. Employment/ Homemaking 2 = I can perform most of my homemaking/job duties, but pain prevents me from performing more physically stressful activities (eg, lifting, vacuuming).  Total 28/50   Interpretation of scores: Score Category Description  0-20% Minimal Disability The patient can cope with most living activities. Usually no treatment is indicated apart from advice on lifting, sitting and exercise  21-40% Moderate Disability The patient experiences more pain and difficulty with sitting, lifting and standing. Travel and social life are more difficult and they may be disabled from work. Personal care, sexual activity and sleeping are not grossly affected, and the patient can usually be managed by conservative means  41-60% Severe Disability Pain remains the main problem in this group, but activities of daily living are affected. These patients require a detailed investigation  61-80% Crippled Back pain impinges on all aspects of the patient's life. Positive intervention is required  81-100% Bed-bound  These patients are either bed-bound or exaggerating their symptoms  Bluford FORBES Zoe DELENA Karon DELENA, et al. Surgery versus conservative management of stable thoracolumbar fracture: the PRESTO feasibility RCT. [place] ([place]): [employer]; 2021 Nov. [facility] Technology Assessment, No. 25.62.) Appendix 3, Oswestry Disability Index category descriptors. Available from: Find[employer]el[employer].cz  Minimally Clinically Important Difference (MCID) = 12.8%                                                                                                                               TREATMENT DATE: 10/25/2023  L BP in sitting prior to interventions: Today's Vitals   10/25/23 1104  BP: 102/67  Pulse: 74   Seated cervical exercises for pain:  -Manual traction w/ towel - no relief  -Cervical extension SNAGS x10 - reports increased tension in upper traps  -Cervical rotation SNAGS x10 each direction - no relief, mild discomfort to the right   L Squat pivot w/c > mat SBA, time spent positioning pt in hook-lying w/ bolster support under knees for LBP (no relief per pt) and addition pillows and moist heat pack for shoulder and neck comfort (mild relief):  -Cervical retraction 2x10, no pain  -Chin tuck 2x12 w/ 2 second hold, return demo to correct form  -Cervical rotation stretch 3x20 seconds each side  Removed bolster for low back stretching:  -LTRs x20 each direction - right low back pain increased and in thighs  -Supine piriformis stretch 2x45 seconds each side - pain in R hip  R squat pivot mat > wheelchair:  -Seated upper trap stretch 2x45 seconds each side, edu on gentle overpressure for mild stretch vs pain  PATIENT EDUCATION: Education details: Goal assessment next visit w/ plan for re-cert to continue working on upright tolerance.  Additions to HEP today for neck mobility - reprinted entire HEP as pt lost paper.  Log rolling to get out of bed to decrease strain on back as she goes into long sitting on mat.  Discussed not stretching neck during active headache days but on good days to help prevent symptomatic days - discussed modifying ROM to prevent nerve symptoms down arms. Person educated: Patient and fiance Education method: Explanation, Demonstration, and Verbal cues Education comprehension: verbalized understanding and needs further education  HOME EXERCISE PROGRAM: STS at counter w/ supervision - slow transitions Seated - overhead arm press > forward press > lateral press  Access Code: [email or web address] URL:  [email or web address] Date: 10/18/2023 Prepared by: Daved Bull  Exercises - Supine Bridge  - 1 x daily - 4-5 x weekly - 1-2 sets - 10 reps - Supine March  - 1 x daily - 4-5 x weekly - 1-2 sets - 10 reps - Clamshell with Resistance  - 1 x daily - 4-5 x weekly - 1-2 sets - 10-15 reps - Seated March With Resistance at Feet  - 1 x daily - 4-5 x weekly - 1-2 sets - 20 reps - Sitting Knee Extension with Resistance  - 1 x daily - 4-5 x weekly - 1-2 sets - 10 reps - Seated Heel Toe Raises  - 1 x daily - 4-5 x weekly - 2-3 sets - 10 reps - Supine Cervical Retraction with Towel  - 1 x daily - 4 x weekly - 1-2 sets - 10 reps - Supine Chin Tuck  - 1 x daily - 4 x weekly - 1-2 sets - 10 reps - Seated Upper Trapezius Stretch  - 1 x daily - 4 x weekly - 1 sets - 2-3 reps - 45 seconds hold  GOALS: Goals reviewed with patient? Yes  SHORT TERM GOALS: Target date: 10/04/2023  Pt will be independent and compliant with introductory strength and balance focused HEP in order to maintain functional progress and improve mobility. Baseline:  To be established. Goal status: INITIAL  2.  Pt will demonstrate TUG of </=32.12 seconds in order to decrease risk of falls and improve functional mobility using LRAD. Baseline: 42.12 sec CGA and HHA (7/9) Goal status: INITIAL  3.  Pt will ambulate>/=50 feet on to demonstrate improved endurance for functional tasks in home and community. Baseline: 59ft HHA and CGA w/ +2 w/c follow (7/9) Goal status: INITIAL  4.  Pt will demonstrate a gait speed of >/=1.03 feet/sec in order to decrease risk for falls. Baseline: 0.83 ft/sec HHA (left hand) and CGA w/ +2 w/c follow (7/9) Goal status: INITIAL  LONG TERM GOALS: Target date: 11/01/2023  Pt will be independent and compliant with advanced program to include: functional transfers, BLE strengthening, core stability, static balance and endurance in order to maintain functional progress and improve  mobility. Baseline: To be established. Goal status: INITIAL  2.  Pt will demonstrate TUG of </=22.12 seconds in order to decrease risk of falls and improve functional mobility using LRAD. Baseline: 42.12 sec CGA and HHA (7/9) Goal status: INITIAL  3.  Pt will ambulate>/=75 feet on to demonstrate improved endurance for functional tasks in home and community. Baseline: 86ft HHA and CGA w/ +2 w/c follow (7/9) Goal status: INITIAL  4.  Pt will demonstrate a gait speed of >/=1.23 feet/sec in order to decrease risk for falls.  Baseline: 0.83 ft/sec HHA (left hand) and CGA w/ +2 w/c follow (7/9) Goal status: INITIAL  5.  Modified Oswestry score to improve to </=43.2% in order to demonstrate a clinically significant improvement in functional status and pain management. Baseline: 56% Goal status: INITIAL  ASSESSMENT:  CLINICAL IMPRESSION: Focus of skilled session today on mobility and pain management of the cervical and low back regions.  Pt was limited by severe fatigue and pain due to lack of sleep last night.  Her R hemibody seemed to have a higher pain response to all stretching and strength related activities today.  Focused in hook-lying for most comfort with moist heat with minimal impact on discomfort.  Made addendum to HEP to incorporate tolerated tasks today.  Will assess LTGs and recert next session to continue working towards upright tolerance.  Continue per POC.  OBJECTIVE IMPAIRMENTS: Abnormal gait, cardiopulmonary status limiting activity, decreased activity tolerance, decreased balance, decreased coordination, decreased knowledge of condition, decreased knowledge of use of DME, decreased strength, dizziness, improper body mechanics, postural dysfunction, and pain.   ACTIVITY LIMITATIONS: carrying, lifting, bending, standing, squatting, stairs, transfers, bed mobility, bathing, toileting, dressing, reach over head, hygiene/grooming, and locomotion level  PARTICIPATION  LIMITATIONS: meal prep, cleaning, laundry, driving, shopping, community activity, and occupation  PERSONAL FACTORS: Behavior pattern, Fitness, Past/current experiences, Time since onset of injury/illness/exacerbation, and 3+ comorbidities: psychological factors, chronic migraines, and ongoing workup for cardiovascular factors are also affecting patient's functional outcome.   REHAB POTENTIAL: Good  CLINICAL DECISION MAKING: Evolving/moderate complexity  EVALUATION COMPLEXITY: Moderate  PLAN:  PT FREQUENCY: 1x/week  PT DURATION: 8 weeks  PLANNED INTERVENTIONS: 97164- PT Re-evaluation, 97750- Physical Performance Testing, 97110-Therapeutic exercises, 97530- Therapeutic activity, [id number]- Neuromuscular re-education, 97535- Self Care, [id number]- Manual therapy, [id number]- Gait training, [phone]- Electrical stimulation (manual), [phone] (1-2 muscles), [id number] (3+ muscles)- Dry Needling, Patient/Family education, Balance training, Stair training, Taping, Vestibular training, DME instructions, Wheelchair mobility training, Cryotherapy, and Moist heat  PLAN FOR NEXT SESSION: L BP.  SciFit vs arm bike for endurance.  Modify HEP for low level core, calf and LE focused strengthening HEP > progress to balance/standing tolerance.  Rollator?  ASSESS LTGs- re-cert for 1x/wk for 4 wks?  Aquatic therapy previously did not help.  Daved KATHEE Bull, PT, DPT 10/25/2023, 11:46 AM

## 2023-10-25 NOTE — Patient Instructions (Signed)
-   Supine Cervical Retraction with Towel  - 1 x daily - 4 x weekly - 1-2 sets - 10 reps - Supine Chin Tuck  - 1 x daily - 4 x weekly - 1-2 sets - 10 reps - Seated Upper Trapezius Stretch  - 1 x daily - 4 x weekly - 1 sets - 2-3 reps - 45 seconds hold

## 2023-11-01 ENCOUNTER — Encounter: Payer: Self-pay | Admitting: Physical Therapy

## 2023-11-01 ENCOUNTER — Ambulatory Visit: Admitting: Physical Therapy

## 2023-11-01 VITALS — BP 111/74 | HR 99

## 2023-11-01 DIAGNOSIS — R293 Abnormal posture: Secondary | ICD-10-CM

## 2023-11-01 DIAGNOSIS — M25551 Pain in right hip: Secondary | ICD-10-CM

## 2023-11-01 DIAGNOSIS — M546 Pain in thoracic spine: Secondary | ICD-10-CM

## 2023-11-01 DIAGNOSIS — M6281 Muscle weakness (generalized): Secondary | ICD-10-CM

## 2023-11-01 DIAGNOSIS — G8929 Other chronic pain: Secondary | ICD-10-CM

## 2023-11-01 DIAGNOSIS — M5459 Other low back pain: Secondary | ICD-10-CM

## 2023-11-01 DIAGNOSIS — R2681 Unsteadiness on feet: Secondary | ICD-10-CM

## 2023-11-01 NOTE — Therapy (Signed)
 OUTPATIENT PHYSICAL THERAPY NEURO TREATMENT - RECERTIFICATION   Patient Name: Katelyn Best MRN: [id number] DOB:02/29/2001, 23 y.o., female Today's Date: 11/01/2023   PCP: Tobias Domino, NP ([place]) REFERRING PROVIDER: Constantino Bong, MD  END OF SESSION:  PT End of Session - 11/01/23 1114     Visit Number 6    Number of Visits 13   8 + eval   Date for PT Re-Evaluation 12/06/23   pushed out due to potential scheduling delay   Authorization Type [employer]    PT Start Time 1106    PT Stop Time 1145    PT Time Calculation (min) 39 min    Equipment Utilized During Treatment Gait belt    Activity Tolerance Patient tolerated treatment well;Patient limited by fatigue    Behavior During Therapy WFL for tasks assessed/performed         Past Medical History:  Diagnosis Date   ADHD (attention deficit hyperactivity disorder)    Anxiety    Asthma    Bipolar disorder (HCC)    Chronic migraine    CKD (chronic kidney disease) stage 2, GFR 60-89 ml/min    Depression    Strep pharyngitis    Past Surgical History:  Procedure Laterality Date   WISDOM TOOTH EXTRACTION  09/09/2017   Patient Active Problem List   Diagnosis Date Noted   Chronic migraine 03/14/2018   Migraine without aura and without status migrainosus, not intractable 03/14/2018   Adjustment disorder with anxious mood     ONSET DATE: since 3rd grade  REFERRING DIAG: G89.4 (ICD-10-CM) - Chronic pain syndrome  THERAPY DIAG:  Abnormal posture  Other low back pain  Pain in thoracic spine  Muscle weakness (generalized)  Unsteadiness on feet  Chronic pain of both shoulders  Pain of both hip joints  Rationale for Evaluation and Treatment: Rehabilitation  SUBJECTIVE:                                                                                                                                                                                             SUBJECTIVE STATEMENT: Pt denies recent falls or  episodes of syncope.  She reports she almost overslept this morning as she has been very tired recently.  She was started on a new cardiac medicine and 2 headache medicines. Pt accompanied by: significant other (fiance - Josh)  PERTINENT HISTORY: Chronic migraine, ADHD, anxiety, asthma, Bipolar disorder, CKD stage 2, depression  PAIN:  Are you having pain? Yes: NPRS scale: 3 Pain location: legs Pain description: soreness Aggravating factors: most movement of the leg Relieving factors: nothing  PRECAUTIONS: Fall  RED FLAGS:  Bowel or bladder incontinence: Yes: reports infrequent but B/B incontinence does occur   WEIGHT BEARING RESTRICTIONS: No  FALLS: Has patient fallen in last 6 months? Yes. Number of falls at least 12 per week, pt reports she sometimes falls due to syncope/near syncope, sometimes due to imbalance  LIVING ENVIRONMENT: Lives with: lives with their partner Lives in: Mobile home Stairs: Yes: External: 3 steps; on right going up Has following equipment at home: Wheelchair (manual)  PLOF: Requires assistive device for independence, Needs assistance with ADLs, Needs assistance with homemaking, Needs assistance with gait, Needs assistance with transfers, and she sometimes uses HHA for walking at home, she does not walk in community - uses manual wheelchair  PATIENT GOALS: Figuring out what's causing the chronic pain, having someone listen to me.  OBJECTIVE:  Note: Objective measures were completed at Evaluation unless otherwise noted.  DIAGNOSTIC FINDINGS: No recent relevant imaging.  COGNITION: Overall cognitive status: Within functional limits for tasks assessed   SENSATION: Light touch: WFL  COORDINATION: LE RAMS:  WFL Bilateral Heel-to-shin:  unable to due to reporting tightness and pain around thigh, she is symmetrically limited and reports it's weird, I can't do this, but I can still do the splits.  EDEMA:  None noted in BLE  MUSCLE TONE: None noted  in BLE  POSTURE: rounded shoulders, forward head, and posterior pelvic tilt  LOWER EXTREMITY ROM/MMT:     Active  Right Eval Left Eval  Hip flexion 4/5 4+  Hip extension    Hip abduction 4/5 4/5  Hip adduction    Hip internal rotation    Hip external rotation    Knee flexion 4/5 4+/5  Knee extension 4-/5 4/5  Ankle dorsiflexion 5/5 5/5  Ankle plantarflexion    Ankle inversion    Ankle eversion     (Blank rows = not tested)   BED MOBILITY:  Not tested - sleeps on the couch, other option is mattress on floor which is harder because it is lower  TRANSFERS: Sit to stand: Modified independence  Assistive device utilized: None     Stand to sit: Modified independence  Assistive device utilized: None     Chair to chair: CGA  Assistive device utilized: None     -using features of chair to STS and transfer  RAMP:  Not tested  CURB:  Not tested  STAIRS: Not tested GAIT: Findings: Gait Characteristics: step to pattern, decreased arm swing- Right, decreased arm swing- Left, decreased step length- Left, decreased stance time- Right, decreased stride length, and decreased hip/knee flexion- Right, Distance walked: 8 ft, Assistive device utilized:None, Level of assistance: CGA, and Comments: antalgic gait, pt reports mild dizziness that resolves in sitting <30 seconds  FUNCTIONAL TESTS:  Timed up and go (TUG): TBA 2 minute walk test: TBA 10 meter walk test: TBA  PATIENT SURVEYS:  Modified Oswestry:  MODIFIED OSWESTRY DISABILITY SCALE  Date: 09/06/2023 Score  Pain intensity 5 =  Pain medication has no effect on my pain.  2. Personal care (washing, dressing, etc.) 3 =  I need help, but I am able to manage most of my personal care.  3. Lifting 4 = I can lift only very light weights  4. Walking 3 =  Pain prevents me from walking more than  mile.  5. Sitting 2 =  Pain prevents me from sitting more than 1 hour.  6. Standing 4 =  Pain prevents me from standing more than 10 minutes.   7. Sleeping 4 =  Even when I take pain medication, I sleep less than 2 hour  8. Social Life 0 = My social life is normal and does not increase my pain.  9. Traveling 1 =  I can travel anywhere, but it increases my pain.  10. Employment/ Homemaking 2 = I can perform most of my homemaking/job duties, but pain prevents me from performing more physically stressful activities (eg, lifting, vacuuming).  Total 28/50   Interpretation of scores: Score Category Description  0-20% Minimal Disability The patient can cope with most living activities. Usually no treatment is indicated apart from advice on lifting, sitting and exercise  21-40% Moderate Disability The patient experiences more pain and difficulty with sitting, lifting and standing. Travel and social life are more difficult and they may be disabled from work. Personal care, sexual activity and sleeping are not grossly affected, and the patient can usually be managed by conservative means  41-60% Severe Disability Pain remains the main problem in this group, but activities of daily living are affected. These patients require a detailed investigation  61-80% Crippled Back pain impinges on all aspects of the patient's life. Positive intervention is required  81-100% Bed-bound  These patients are either bed-bound or exaggerating their symptoms  Bluford FORBES Zoe DELENA Karon DELENA, et al. Surgery versus conservative management of stable thoracolumbar fracture: the PRESTO feasibility RCT. [place] ([place]): [employer]; 2021 Nov. [facility] Technology Assessment, No. 25.62.) Appendix 3, Oswestry Disability Index category descriptors. Available from: FindJewel[employer].cz  Minimally Clinically Important Difference (MCID) = 12.8%                                                                                                                              TREATMENT DATE: 11/01/2023  L BP in sitting prior to interventions: Today's  Vitals   11/01/23 1111  BP: 111/74  Pulse: 99  -TUG:  25.81 sec CGA no AD - :  11.69 sec CGA no AD = 0.86 m/sec OR 2.82 ft/sec - :  230 ft CGA no AD (wheelchair follow for safety) w/ single standing rest break and cues for diaphragmatic breathing -Modified Oswestry (assisted by caregiver - requires increased time to complete):  30/50 = 60%  PATIENT EDUCATION: Education details: Ways to increase compliance with HEP slowly - goal of 2-3 days in coming week.  Process for re-cert today and ongoing goals.  Progress towards goals.  Printed options to purchase wheelchair seatbelt - DME companies vs [employer]. Person educated: Patient and fiance - Josh Education method: Explanation, Demonstration, and Verbal cues Education comprehension: verbalized understanding and needs further education  HOME EXERCISE PROGRAM: STS at counter w/ supervision - slow transitions Seated - overhead arm press > forward press > lateral press  Access Code: [email or web address] URL: [email or web address] Date: 10/18/2023 Prepared by: Daved Bull  Exercises - Supine Bridge  - 1 x daily - 4-5 x weekly - 1-2 sets - 10 reps - Supine March  - 1 x daily - 4-5  x weekly - 1-2 sets - 10 reps - Clamshell with Resistance  - 1 x daily - 4-5 x weekly - 1-2 sets - 10-15 reps - Seated March With Resistance at Feet  - 1 x daily - 4-5 x weekly - 1-2 sets - 20 reps - Sitting Knee Extension with Resistance  - 1 x daily - 4-5 x weekly - 1-2 sets - 10 reps - Seated Heel Toe Raises  - 1 x daily - 4-5 x weekly - 2-3 sets - 10 reps - Supine Cervical Retraction with Towel  - 1 x daily - 4 x weekly - 1-2 sets - 10 reps - Supine Chin Tuck  - 1 x daily - 4 x weekly - 1-2 sets - 10 reps - Seated Upper Trapezius Stretch  - 1 x daily - 4 x weekly - 1 sets - 2-3 reps - 45 seconds hold  GOALS: Goals reviewed with patient? Yes  SHORT TERM GOALS: Target date: 10/04/2023  Pt will be independent and compliant with introductory  strength and balance focused HEP in order to maintain functional progress and improve mobility. Baseline:  To be established. Goal status: INITIAL  2.  Pt will demonstrate TUG of </=32.12 seconds in order to decrease risk of falls and improve functional mobility using LRAD. Baseline: 42.12 sec CGA and HHA (7/9) Goal status: INITIAL  3.  Pt will ambulate>/=50 feet on to demonstrate improved endurance for functional tasks in home and community. Baseline: 74ft HHA and CGA w/ +2 w/c follow (7/9) Goal status: INITIAL  4.  Pt will demonstrate a gait speed of >/=1.03 feet/sec in order to decrease risk for falls. Baseline: 0.83 ft/sec HHA (left hand) and CGA w/ +2 w/c follow (7/9) Goal status: INITIAL  LONG TERM GOALS: Target date: 11/01/2023  Pt will be independent and compliant with advanced program to include: functional transfers, BLE strengthening, core stability, static balance and endurance in order to maintain functional progress and improve mobility. Baseline: Established, but she has been too tired to do recently (8/15) Goal status: ONGOING  2.  Pt will demonstrate TUG of </=22.12 seconds in order to decrease risk of falls and improve functional mobility using LRAD. Baseline: 42.12 sec CGA and HHA (7/9); 25.81 sec CGA no AD (8/15) Goal status: PARTIALLY MET  3.  Pt will ambulate>/=75 feet on to demonstrate improved endurance for functional tasks in home and community. Baseline: 49ft HHA and CGA w/ +2 w/c follow (7/9); 230 ft CGA no AD (wheelchair follow for safety) w/ single standing rest break (8/15) Goal status: MET  4.  Pt will demonstrate a gait speed of >/=1.23 feet/sec in order to decrease risk for falls. Baseline: 0.83 ft/sec HHA (left hand) and CGA w/ +2 w/c follow (7/9); 2.82 ft/sec no AD CGA (8/15) Goal status: MET  5.  Modified Oswestry score to improve to </=43.2% in order to demonstrate a clinically significant improvement in functional status and pain  management. Baseline: 56%; 30/50 = 60% (8/15) Goal status: NOT MET  GOALS (at re-cert 1/84): Goals reviewed with patient? Yes  SHORT TERM LONG = LONG TERM GOALS: Target date: 11/29/2023  Pt will be independent and compliant with advanced program to include: functional transfers, BLE strengthening, core stability, static balance and endurance in order to maintain functional progress and improve mobility. Baseline: Established, but she has been too tired to do recently (8/15) Goal status: INITIAL  2.  Pt will demonstrate TUG of </=20.12 seconds in order to decrease risk  of falls and improve functional mobility using LRAD. Baseline: 42.12 sec CGA and HHA (7/9); 25.81 sec CGA no AD (8/15) Goal status: REVISED  3.  Pt will ambulate>/=330 feet on to demonstrate improved endurance for functional tasks in home and community. Baseline: 78ft HHA and CGA w/ +2 w/c follow (7/9); 230 ft CGA no AD (wheelchair follow for safety) w/ single standing rest break (8/15) Goal status: REVISED  4.  Pt will demonstrate a gait speed of >/=3.02 feet/sec in order to decrease risk for falls. Baseline: 0.83 ft/sec HHA (left hand) and CGA w/ +2 w/c follow (7/9); 2.82 ft/sec no AD CGA (8/15) Goal status: REVISED  5.  Modified Oswestry score to improve to </=43.2% in order to demonstrate a clinically significant improvement in functional status and pain management. Baseline: 56%; 30/50 = 60% (8/15) Goal status: INITIAL  ASSESSMENT:  CLINICAL IMPRESSION: Assessed LTGs this visit as STG were never assessed due to pt missing visits due to lack of transportation and financial strain.  She performed much improved today compared to evaluation on all objective metrics.  Her HEP compliance has been low due to recent fatigue and pain, but discussed ways to improve this in coming weeks.  Her walking speed improved to 2.82 ft/sec and she ambulates without AD or HHA today for a total of 230 ft.  Her TUG time improved to  just before goal level at 25.81 seconds demonstrating drastically improved fall risk compared to prior 42.12 seconds.  Her progress may be partially due to fluctuation in her symptoms, but she would benefit from continued skilled PT intervention to progress strength and activity tolerance and optimize functional independence.  Continue per POC.  OBJECTIVE IMPAIRMENTS: Abnormal gait, cardiopulmonary status limiting activity, decreased activity tolerance, decreased balance, decreased coordination, decreased knowledge of condition, decreased knowledge of use of DME, decreased strength, dizziness, improper body mechanics, postural dysfunction, and pain.   ACTIVITY LIMITATIONS: carrying, lifting, bending, standing, squatting, stairs, transfers, bed mobility, bathing, toileting, dressing, reach over head, hygiene/grooming, and locomotion level  PARTICIPATION LIMITATIONS: meal prep, cleaning, laundry, driving, shopping, community activity, and occupation  PERSONAL FACTORS: Behavior pattern, Fitness, Past/current experiences, Time since onset of injury/illness/exacerbation, and 3+ comorbidities: psychological factors, chronic migraines, and ongoing workup for cardiovascular factors are also affecting patient's functional outcome.   REHAB POTENTIAL: Good  CLINICAL DECISION MAKING: Evolving/moderate complexity  EVALUATION COMPLEXITY: Moderate  PLAN:  PT FREQUENCY: 1x/week  PT DURATION: 8 weeks + 4 wks  PLANNED INTERVENTIONS: [id number]- PT Re-evaluation, 97750- Physical Performance Testing, 97110-Therapeutic exercises, 97530- Therapeutic activity, [id number]- Neuromuscular re-education, 97535- Self Care, [id number]- Manual therapy, [id number]- Gait training, [phone]- Electrical stimulation (manual), [phone] (1-2 muscles), [id number] (3+ muscles)- Dry Needling, Patient/Family education, Balance training, Stair training, Taping, Vestibular training, DME instructions, Wheelchair mobility training, Cryotherapy, and Moist heat  PLAN  FOR NEXT SESSION: L BP.  SciFit vs arm bike for endurance.  Modify HEP for low level core, calf and LE focused strengthening HEP > progress to balance/standing tolerance.  Rollator?  Aquatic therapy previously did not help.  Daved KATHEE Bull, PT, DPT 11/01/2023, 11:57 AM

## 2023-11-08 ENCOUNTER — Ambulatory Visit: Admitting: Physical Therapy

## 2023-11-15 ENCOUNTER — Ambulatory Visit: Admitting: Physical Therapy

## 2023-11-15 ENCOUNTER — Encounter: Payer: Self-pay | Admitting: Physical Therapy

## 2023-11-15 VITALS — BP 103/63 | HR 86

## 2023-11-15 DIAGNOSIS — R293 Abnormal posture: Secondary | ICD-10-CM | POA: Diagnosis not present

## 2023-11-15 DIAGNOSIS — R2681 Unsteadiness on feet: Secondary | ICD-10-CM

## 2023-11-15 DIAGNOSIS — M25551 Pain in right hip: Secondary | ICD-10-CM

## 2023-11-15 DIAGNOSIS — G8929 Other chronic pain: Secondary | ICD-10-CM

## 2023-11-15 DIAGNOSIS — M6281 Muscle weakness (generalized): Secondary | ICD-10-CM

## 2023-11-15 DIAGNOSIS — M546 Pain in thoracic spine: Secondary | ICD-10-CM

## 2023-11-15 DIAGNOSIS — M5459 Other low back pain: Secondary | ICD-10-CM

## 2023-11-15 NOTE — Therapy (Signed)
 OUTPATIENT PHYSICAL THERAPY NEURO TREATMENT   Patient Name: Katelyn Best MRN: [id number] DOB:May 10, 2000, 22 y.o., female Today's Date: 11/15/2023   PCP: Tobias Domino, NP ([place]) REFERRING PROVIDER: Constantino Bong, MD  END OF SESSION:  PT End of Session - 11/15/23 0807     Visit Number 7    Number of Visits 13   8 + eval   Date for PT Re-Evaluation 12/06/23   pushed out due to potential scheduling delay   Authorization Type [employer]    PT Start Time 0805   pt late   PT Stop Time 0849    PT Time Calculation (min) 44 min    Equipment Utilized During Treatment Gait belt    Activity Tolerance Patient tolerated treatment well;Patient limited by fatigue    Behavior During Therapy WFL for tasks assessed/performed         Past Medical History:  Diagnosis Date   ADHD (attention deficit hyperactivity disorder)    Anxiety    Asthma    Bipolar disorder (HCC)    Chronic migraine    CKD (chronic kidney disease) stage 2, GFR 60-89 ml/min    Depression    Strep pharyngitis    Past Surgical History:  Procedure Laterality Date   WISDOM TOOTH EXTRACTION  09/09/2017   Patient Active Problem List   Diagnosis Date Noted   Chronic migraine 03/14/2018   Migraine without aura and without status migrainosus, not intractable 03/14/2018   Adjustment disorder with anxious mood     ONSET DATE: since 3rd grade  REFERRING DIAG: G89.4 (ICD-10-CM) - Chronic pain syndrome  THERAPY DIAG:  Abnormal posture  Other low back pain  Pain in thoracic spine  Muscle weakness (generalized)  Unsteadiness on feet  Chronic pain of both shoulders  Pain of both hip joints  Rationale for Evaluation and Treatment: Rehabilitation  SUBJECTIVE:                                                                                                                                                                                             SUBJECTIVE STATEMENT: Pt denies recent falls or episodes of  syncope.  She is still trying to get insurance approval for her headache medicine.  She is having a mild headache today. Pt accompanied by: significant other (fiance - Josh)  PERTINENT HISTORY: Chronic migraine, ADHD, anxiety, asthma, Bipolar disorder, CKD stage 2, depression  PAIN:  Are you having pain? Yes: NPRS scale: 3-4 Pain location: legs Pain description: soreness Aggravating factors: most movement of the leg Relieving factors: nothing  PRECAUTIONS: Fall  RED FLAGS: Bowel or bladder incontinence: Yes: reports  infrequent but B/B incontinence does occur   WEIGHT BEARING RESTRICTIONS: No  FALLS: Has patient fallen in last 6 months? Yes. Number of falls at least 12 per week, pt reports she sometimes falls due to syncope/near syncope, sometimes due to imbalance  LIVING ENVIRONMENT: Lives with: lives with their partner Lives in: Mobile home Stairs: Yes: External: 3 steps; on right going up Has following equipment at home: Wheelchair (manual)  PLOF: Requires assistive device for independence, Needs assistance with ADLs, Needs assistance with homemaking, Needs assistance with gait, Needs assistance with transfers, and she sometimes uses HHA for walking at home, she does not walk in community - uses manual wheelchair  PATIENT GOALS: Figuring out what's causing the chronic pain, having someone listen to me.  OBJECTIVE:  Note: Objective measures were completed at Evaluation unless otherwise noted.  DIAGNOSTIC FINDINGS: No recent relevant imaging.  COGNITION: Overall cognitive status: Within functional limits for tasks assessed   SENSATION: Light touch: WFL  COORDINATION: LE RAMS:  WFL Bilateral Heel-to-shin:  unable to due to reporting tightness and pain around thigh, she is symmetrically limited and reports it's weird, I can't do this, but I can still do the splits.  EDEMA:  None noted in BLE  MUSCLE TONE: None noted in BLE  POSTURE: rounded shoulders, forward  head, and posterior pelvic tilt  LOWER EXTREMITY ROM/MMT:     Active  Right Eval Left Eval  Hip flexion 4/5 4+  Hip extension    Hip abduction 4/5 4/5  Hip adduction    Hip internal rotation    Hip external rotation    Knee flexion 4/5 4+/5  Knee extension 4-/5 4/5  Ankle dorsiflexion 5/5 5/5  Ankle plantarflexion    Ankle inversion    Ankle eversion     (Blank rows = not tested)   BED MOBILITY:  Not tested - sleeps on the couch, other option is mattress on floor which is harder because it is lower  TRANSFERS: Sit to stand: Modified independence  Assistive device utilized: None     Stand to sit: Modified independence  Assistive device utilized: None     Chair to chair: CGA  Assistive device utilized: None     -using features of chair to STS and transfer  RAMP:  Not tested  CURB:  Not tested  STAIRS: Not tested GAIT: Findings: Gait Characteristics: step to pattern, decreased arm swing- Right, decreased arm swing- Left, decreased step length- Left, decreased stance time- Right, decreased stride length, and decreased hip/knee flexion- Right, Distance walked: 8 ft, Assistive device utilized:None, Level of assistance: CGA, and Comments: antalgic gait, pt reports mild dizziness that resolves in sitting <30 seconds  FUNCTIONAL TESTS:  Timed up and go (TUG): TBA 2 minute walk test: TBA 10 meter walk test: TBA  PATIENT SURVEYS:  Modified Oswestry:  MODIFIED OSWESTRY DISABILITY SCALE  Date: 09/06/2023 Score  Pain intensity 5 =  Pain medication has no effect on my pain.  2. Personal care (washing, dressing, etc.) 3 =  I need help, but I am able to manage most of my personal care.  3. Lifting 4 = I can lift only very light weights  4. Walking 3 =  Pain prevents me from walking more than  mile.  5. Sitting 2 =  Pain prevents me from sitting more than 1 hour.  6. Standing 4 =  Pain prevents me from standing more than 10 minutes.  7. Sleeping 4 =  Even when I take pain  medication, I sleep less than 2 hour  8. Social Life 0 = My social life is normal and does not increase my pain.  9. Traveling 1 =  I can travel anywhere, but it increases my pain.  10. Employment/ Homemaking 2 = I can perform most of my homemaking/job duties, but pain prevents me from performing more physically stressful activities (eg, lifting, vacuuming).  Total 28/50   Interpretation of scores: Score Category Description  0-20% Minimal Disability The patient can cope with most living activities. Usually no treatment is indicated apart from advice on lifting, sitting and exercise  21-40% Moderate Disability The patient experiences more pain and difficulty with sitting, lifting and standing. Travel and social life are more difficult and they may be disabled from work. Personal care, sexual activity and sleeping are not grossly affected, and the patient can usually be managed by conservative means  41-60% Severe Disability Pain remains the main problem in this group, but activities of daily living are affected. These patients require a detailed investigation  61-80% Crippled Back pain impinges on all aspects of the patient's life. Positive intervention is required  81-100% Bed-bound  These patients are either bed-bound or exaggerating their symptoms  Bluford FORBES Zoe DELENA Karon DELENA, et al. Surgery versus conservative management of stable thoracolumbar fracture: the PRESTO feasibility RCT. [place] ([place]): [employer]; 2021 Nov. [facility] Technology Assessment, No. 25.62.) Appendix 3, Oswestry Disability Index category descriptors. Available from: FindJewel[employer].cz  Minimally Clinically Important Difference (MCID) = 12.8%                                                                                                                              TREATMENT DATE: 11/15/2023  L BP in sitting prior to interventions: Today's Vitals   11/15/23 0811  BP: 103/63   Pulse: 86   SciFit multi-peaks mode x8 minutes up to level 4.0 using BUE/BLE support for global strength and endurance.  RPE 6/10 following task w/ pt maintaining around 90 steps/min throughout task. Therapeutic use of self as pt voices anxiousness regarding lumbar puncture procedure. Pt ambulates SBA-CGA w/ rollator x230 ft + x115 ft, she maintains stiff LE pattern throughout, has some shaking of her hands between bouts reporting this happens when she does more activity than normal - cued for fluidity of stride and decreased UE support to improve control of device sway.  PATIENT EDUCATION: Education details: Discussed wheelchair referral vs ambulatory device - insurance will only pay for one; discussed diagnoses/lack of diagnoses, up to 20% copay, goals of being more ambulatory.  They also do not have the space in their home for a wheelchair.  PT encouraging working towards walking and standing based on pt personal goals of becoming a nurse.  See HEP section for further.  Will pursue rollator when she has progressed to more independence w/ device. Person educated: Patient and fiance - Josh Education method: Explanation, Demonstration, and Verbal cues Education comprehension: verbalized understanding  and needs further education  HOME EXERCISE PROGRAM: Stand at counter 3-4x2 minute bouts daily.  Work on consistent morning and night routine to regulate sleep schedule and planned standing time throughout the day.  Correcting IR of R foot in standing for prolonged static stretch of hip and then when ambulating use a visual floor target to align foot better.  STS at counter w/ supervision - slow transitions Seated - overhead arm press > forward press > lateral press  Access Code: [email or web address] URL: [email or web address] Date: 10/18/2023 Prepared by: Daved Bull  Exercises - Supine Bridge  - 1 x daily - 4-5 x weekly - 1-2 sets - 10 reps - Supine March  - 1 x daily - 4-5 x weekly -  1-2 sets - 10 reps - Clamshell with Resistance  - 1 x daily - 4-5 x weekly - 1-2 sets - 10-15 reps - Seated March With Resistance at Feet  - 1 x daily - 4-5 x weekly - 1-2 sets - 20 reps - Sitting Knee Extension with Resistance  - 1 x daily - 4-5 x weekly - 1-2 sets - 10 reps - Seated Heel Toe Raises  - 1 x daily - 4-5 x weekly - 2-3 sets - 10 reps - Supine Cervical Retraction with Towel  - 1 x daily - 4 x weekly - 1-2 sets - 10 reps - Supine Chin Tuck  - 1 x daily - 4 x weekly - 1-2 sets - 10 reps - Seated Upper Trapezius Stretch  - 1 x daily - 4 x weekly - 1 sets - 2-3 reps - 45 seconds hold  GOALS: Goals reviewed with patient? Yes  SHORT TERM GOALS: Target date: 10/04/2023  Pt will be independent and compliant with introductory strength and balance focused HEP in order to maintain functional progress and improve mobility. Baseline:  To be established. Goal status: INITIAL  2.  Pt will demonstrate TUG of </=32.12 seconds in order to decrease risk of falls and improve functional mobility using LRAD. Baseline: 42.12 sec CGA and HHA (7/9) Goal status: INITIAL  3.  Pt will ambulate>/=50 feet on to demonstrate improved endurance for functional tasks in home and community. Baseline: 7ft HHA and CGA w/ +2 w/c follow (7/9) Goal status: INITIAL  4.  Pt will demonstrate a gait speed of >/=1.03 feet/sec in order to decrease risk for falls. Baseline: 0.83 ft/sec HHA (left hand) and CGA w/ +2 w/c follow (7/9) Goal status: INITIAL  LONG TERM GOALS: Target date: 11/01/2023  Pt will be independent and compliant with advanced program to include: functional transfers, BLE strengthening, core stability, static balance and endurance in order to maintain functional progress and improve mobility. Baseline: Established, but she has been too tired to do recently (8/15) Goal status: ONGOING  2.  Pt will demonstrate TUG of </=22.12 seconds in order to decrease risk of falls and improve functional  mobility using LRAD. Baseline: 42.12 sec CGA and HHA (7/9); 25.81 sec CGA no AD (8/15) Goal status: PARTIALLY MET  3.  Pt will ambulate>/=75 feet on to demonstrate improved endurance for functional tasks in home and community. Baseline: 62ft HHA and CGA w/ +2 w/c follow (7/9); 230 ft CGA no AD (wheelchair follow for safety) w/ single standing rest break (8/15) Goal status: MET  4.  Pt will demonstrate a gait speed of >/=1.23 feet/sec in order to decrease risk for falls. Baseline: 0.83 ft/sec HHA (left hand) and CGA w/ +2 w/c follow (7/9); 2.82 ft/sec  no AD CGA (8/15) Goal status: MET  5.  Modified Oswestry score to improve to </=43.2% in order to demonstrate a clinically significant improvement in functional status and pain management. Baseline: 56%; 30/50 = 60% (8/15) Goal status: NOT MET  GOALS (at re-cert 1/84): Goals reviewed with patient? Yes  SHORT TERM LONG = LONG TERM GOALS: Target date: 11/29/2023  Pt will be independent and compliant with advanced program to include: functional transfers, BLE strengthening, core stability, static balance and endurance in order to maintain functional progress and improve mobility. Baseline: Established, but she has been too tired to do recently (8/15) Goal status: INITIAL  2.  Pt will demonstrate TUG of </=20.12 seconds in order to decrease risk of falls and improve functional mobility using LRAD. Baseline: 42.12 sec CGA and HHA (7/9); 25.81 sec CGA no AD (8/15) Goal status: REVISED  3.  Pt will ambulate>/=330 feet on to demonstrate improved endurance for functional tasks in home and community. Baseline: 33ft HHA and CGA w/ +2 w/c follow (7/9); 230 ft CGA no AD (wheelchair follow for safety) w/ single standing rest break (8/15) Goal status: REVISED  4.  Pt will demonstrate a gait speed of >/=3.02 feet/sec in order to decrease risk for falls. Baseline: 0.83 ft/sec HHA (left hand) and CGA w/ +2 w/c follow (7/9); 2.82 ft/sec no AD CGA  (8/15) Goal status: REVISED  5.  Modified Oswestry score to improve to </=43.2% in order to demonstrate a clinically significant improvement in functional status and pain management. Baseline: 56%; 30/50 = 60% (8/15) Goal status: INITIAL  ASSESSMENT:  CLINICAL IMPRESSION: Sessions continue to be limited by decreased activity tolerance and general fatigue.  She does tolerate standing this visit and longer bouts of ambulation with rollator.  PT likely to pursue rollator over wheelchair options due to pt goal of becoming more ambulatory and PT recommendations of benefit to household management and ability to use rollator in home whereas wheelchair does not fit.  Will pursue order for rollator as pt demonstrates improvement with device management in clinic.  Continue per POC.  OBJECTIVE IMPAIRMENTS: Abnormal gait, cardiopulmonary status limiting activity, decreased activity tolerance, decreased balance, decreased coordination, decreased knowledge of condition, decreased knowledge of use of DME, decreased strength, dizziness, improper body mechanics, postural dysfunction, and pain.   ACTIVITY LIMITATIONS: carrying, lifting, bending, standing, squatting, stairs, transfers, bed mobility, bathing, toileting, dressing, reach over head, hygiene/grooming, and locomotion level  PARTICIPATION LIMITATIONS: meal prep, cleaning, laundry, driving, shopping, community activity, and occupation  PERSONAL FACTORS: Behavior pattern, Fitness, Past/current experiences, Time since onset of injury/illness/exacerbation, and 3+ comorbidities: psychological factors, chronic migraines, and ongoing workup for cardiovascular factors are also affecting patient's functional outcome.   REHAB POTENTIAL: Good  CLINICAL DECISION MAKING: Evolving/moderate complexity  EVALUATION COMPLEXITY: Moderate  PLAN:  PT FREQUENCY: 1x/week  PT DURATION: 8 weeks + 4 wks  PLANNED INTERVENTIONS: [id number]- PT Re-evaluation, 97750- Physical  Performance Testing, 97110-Therapeutic exercises, 97530- Therapeutic activity, [id number]- Neuromuscular re-education, 97535- Self Care, [id number]- Manual therapy, [id number]- Gait training, [phone]- Electrical stimulation (manual), [phone] (1-2 muscles), [id number] (3+ muscles)- Dry Needling, Patient/Family education, Balance training, Stair training, Taping, Vestibular training, DME instructions, Wheelchair mobility training, Cryotherapy, and Moist heat  PLAN FOR NEXT SESSION: L BP.  SciFit vs arm bike for endurance.  Modify HEP for low level core, calf and LE focused strengthening HEP > progress to balance/standing tolerance.  Continue rollator unlevel surfaces/tight spaces, stairs.  Aquatic therapy previously did not help.  Daved KATHEE Bull, PT, DPT  11/15/2023, 9:10 AM

## 2023-11-20 ENCOUNTER — Ambulatory Visit: Attending: Pain Medicine | Admitting: Physical Therapy

## 2023-11-20 ENCOUNTER — Telehealth: Payer: Self-pay | Admitting: Physical Therapy

## 2023-11-20 ENCOUNTER — Encounter: Payer: Self-pay | Admitting: Physical Therapy

## 2023-11-20 VITALS — BP 104/62 | HR 75

## 2023-11-20 DIAGNOSIS — G8929 Other chronic pain: Secondary | ICD-10-CM | POA: Diagnosis present

## 2023-11-20 DIAGNOSIS — M25552 Pain in left hip: Secondary | ICD-10-CM | POA: Diagnosis present

## 2023-11-20 DIAGNOSIS — R293 Abnormal posture: Secondary | ICD-10-CM | POA: Insufficient documentation

## 2023-11-20 DIAGNOSIS — M546 Pain in thoracic spine: Secondary | ICD-10-CM | POA: Insufficient documentation

## 2023-11-20 DIAGNOSIS — M25511 Pain in right shoulder: Secondary | ICD-10-CM | POA: Diagnosis present

## 2023-11-20 DIAGNOSIS — M25512 Pain in left shoulder: Secondary | ICD-10-CM | POA: Insufficient documentation

## 2023-11-20 DIAGNOSIS — M5459 Other low back pain: Secondary | ICD-10-CM | POA: Insufficient documentation

## 2023-11-20 DIAGNOSIS — M25551 Pain in right hip: Secondary | ICD-10-CM | POA: Diagnosis present

## 2023-11-20 DIAGNOSIS — R2681 Unsteadiness on feet: Secondary | ICD-10-CM | POA: Insufficient documentation

## 2023-11-20 DIAGNOSIS — M6281 Muscle weakness (generalized): Secondary | ICD-10-CM | POA: Insufficient documentation

## 2023-11-20 NOTE — Telephone Encounter (Signed)
 Dr. Constantino,  N[facility] Rumalda Lukes  was treated by PT on 11/20/2023.  The patient would benefit from a 4-wheeled walker for safest ambulation.   If you agree, please place an order in [facility] workque in [facility] or fax the order to [phone].  Thank you,  Daved Bull, PT, DPT  [facility] [address] [place], [place]  [address] Phone:  [phone] Fax:  [phone]

## 2023-11-20 NOTE — Therapy (Signed)
 OUTPATIENT PHYSICAL THERAPY NEURO TREATMENT   Patient Name: Katelyn Best MRN: [id number] DOB:[birth year], 23 y.o., female Today's Date: 11/20/2023   PCP: Tobias Domino, NP ([place]) REFERRING PROVIDER: Constantino Bong, MD  END OF SESSION:  PT End of Session - 11/20/23 1025     Visit Number 8    Number of Visits 13   8 + eval   Date for PT Re-Evaluation 12/06/23   pushed out due to potential scheduling delay   Authorization Type [employer]    PT Start Time 1019    PT Stop Time 1102    PT Time Calculation (min) 43 min    Equipment Utilized During Treatment Gait belt    Activity Tolerance Patient tolerated treatment well;Patient limited by fatigue    Behavior During Therapy WFL for tasks assessed/performed         Past Medical History:  Diagnosis Date   ADHD (attention deficit hyperactivity disorder)    Anxiety    Asthma    Bipolar disorder (HCC)    Chronic migraine    CKD (chronic kidney disease) stage 2, GFR 60-89 ml/min    Depression    Strep pharyngitis    Past Surgical History:  Procedure Laterality Date   WISDOM TOOTH EXTRACTION  09/09/2017   Patient Active Problem List   Diagnosis Date Noted   Chronic migraine 03/14/2018   Migraine without aura and without status migrainosus, not intractable 03/14/2018   Adjustment disorder with anxious mood     ONSET DATE: since 3rd grade  REFERRING DIAG: G89.4 (ICD-10-CM) - Chronic pain syndrome  THERAPY DIAG:  Abnormal posture  Other low back pain  Pain in thoracic spine  Muscle weakness (generalized)  Unsteadiness on feet  Chronic pain of both shoulders  Pain of both hip joints  Rationale for Evaluation and Treatment: Rehabilitation  SUBJECTIVE:                                                                                                                                                                                             SUBJECTIVE STATEMENT: Pt denies recent falls or episodes of syncope.   Her R leg was giving her really intense pain yesterday from the knee to the hip, but this has resolved.  She has worse shoulder pain today.  She has done more walking this week when working she used her wheelchair in place of a rollator to push for support. Pt accompanied by: significant other (fiance - Josh)  PERTINENT HISTORY: Chronic migraine, ADHD, anxiety, asthma, Bipolar disorder, CKD stage 2, depression  PAIN:  Are you having pain? Yes: NPRS scale: 5 Pain  location: shoulders Pain description: soreness Aggravating factors: most movement of the leg Relieving factors: nothing  PRECAUTIONS: Fall  RED FLAGS: Bowel or bladder incontinence: Yes: reports infrequent but B/B incontinence does occur   WEIGHT BEARING RESTRICTIONS: No  FALLS: Has patient fallen in last 6 months? Yes. Number of falls at least 12 per week, pt reports she sometimes falls due to syncope/near syncope, sometimes due to imbalance  LIVING ENVIRONMENT: Lives with: lives with their partner Lives in: Mobile home Stairs: Yes: External: 3 steps; on right going up Has following equipment at home: Wheelchair (manual)  PLOF: Requires assistive device for independence, Needs assistance with ADLs, Needs assistance with homemaking, Needs assistance with gait, Needs assistance with transfers, and she sometimes uses HHA for walking at home, she does not walk in community - uses manual wheelchair  PATIENT GOALS: Figuring out what's causing the chronic pain, having someone listen to me.  OBJECTIVE:  Note: Objective measures were completed at Evaluation unless otherwise noted.  DIAGNOSTIC FINDINGS: No recent relevant imaging.  COGNITION: Overall cognitive status: Within functional limits for tasks assessed   SENSATION: Light touch: WFL  COORDINATION: LE RAMS:  WFL Bilateral Heel-to-shin:  unable to due to reporting tightness and pain around thigh, she is symmetrically limited and reports it's weird, I can't do  this, but I can still do the splits.  EDEMA:  None noted in BLE  MUSCLE TONE: None noted in BLE  POSTURE: rounded shoulders, forward head, and posterior pelvic tilt  LOWER EXTREMITY ROM/MMT:     Active  Right Eval Left Eval  Hip flexion 4/5 4+  Hip extension    Hip abduction 4/5 4/5  Hip adduction    Hip internal rotation    Hip external rotation    Knee flexion 4/5 4+/5  Knee extension 4-/5 4/5  Ankle dorsiflexion 5/5 5/5  Ankle plantarflexion    Ankle inversion    Ankle eversion     (Blank rows = not tested)   BED MOBILITY:  Not tested - sleeps on the couch, other option is mattress on floor which is harder because it is lower  TRANSFERS: Sit to stand: Modified independence  Assistive device utilized: None     Stand to sit: Modified independence  Assistive device utilized: None     Chair to chair: CGA  Assistive device utilized: None     -using features of chair to STS and transfer  RAMP:  Not tested  CURB:  Not tested  STAIRS: Not tested GAIT: Findings: Gait Characteristics: step to pattern, decreased arm swing- Right, decreased arm swing- Left, decreased step length- Left, decreased stance time- Right, decreased stride length, and decreased hip/knee flexion- Right, Distance walked: 8 ft, Assistive device utilized:None, Level of assistance: CGA, and Comments: antalgic gait, pt reports mild dizziness that resolves in sitting <30 seconds  FUNCTIONAL TESTS:  Timed up and go (TUG): TBA 2 minute walk test: TBA 10 meter walk test: TBA  PATIENT SURVEYS:  Modified Oswestry:  MODIFIED OSWESTRY DISABILITY SCALE  Date: 09/06/2023 Score  Pain intensity 5 =  Pain medication has no effect on my pain.  2. Personal care (washing, dressing, etc.) 3 =  I need help, but I am able to manage most of my personal care.  3. Lifting 4 = I can lift only very light weights  4. Walking 3 =  Pain prevents me from walking more than  mile.  5. Sitting 2 =  Pain prevents me from  sitting more than 1  hour.  6. Standing 4 =  Pain prevents me from standing more than 10 minutes.  7. Sleeping 4 =  Even when I take pain medication, I sleep less than 2 hour  8. Social Life 0 = My social life is normal and does not increase my pain.  9. Traveling 1 =  I can travel anywhere, but it increases my pain.  10. Employment/ Homemaking 2 = I can perform most of my homemaking/job duties, but pain prevents me from performing more physically stressful activities (eg, lifting, vacuuming).  Total 28/50   Interpretation of scores: Score Category Description  0-20% Minimal Disability The patient can cope with most living activities. Usually no treatment is indicated apart from advice on lifting, sitting and exercise  21-40% Moderate Disability The patient experiences more pain and difficulty with sitting, lifting and standing. Travel and social life are more difficult and they may be disabled from work. Personal care, sexual activity and sleeping are not grossly affected, and the patient can usually be managed by conservative means  41-60% Severe Disability Pain remains the main problem in this group, but activities of daily living are affected. These patients require a detailed investigation  61-80% Crippled Back pain impinges on all aspects of the patient's life. Positive intervention is required  81-100% Bed-bound  These patients are either bed-bound or exaggerating their symptoms  Bluford FORBES Zoe DELENA Karon DELENA, et al. Surgery versus conservative management of stable thoracolumbar fracture: the PRESTO feasibility RCT. [place] ([place]): [employer]; 2021 Nov. [employer] [facility] [facility] Assessment, No. 25.62.) Appendix 3, Oswestry Disability Index category descriptors. Available from: [employer].cz  Minimally Clinically Important Difference (MCID) = 12.8%                                                                                                                               TREATMENT DATE: 11/20/2023  L BP in sitting prior to interventions: Today's Vitals   11/20/23 1024  BP: 104/62  Pulse: 75   Discussed ongoing attempts to develop more regular sleep schedule due to constant fatigue, poor sleep and sleep hygiene - she has pending sleep study that she plans to reschedule due to being out of town. Attempted theracane for 1-2 minutes bilaterally w/o tolerance > reviewed bilateral upper trap stretch 2x45 seconds SciFit multi-peaks mode x8 minutes up to level 5.0 using BUE/BLE support for global strength and endurance.  RPE 7/10 following task w/ pt maintaining around 90-100 steps/min throughout task. 575' SBA w/ rollator, min cues for approaching sitting and fluidity of stride with some improvement throughout.  She has mild chest and thigh tightness w/ progressed distance, but denies difficulty breathing.  SpO2 100%, HR 86bpm.  Discussed monitoring her O2 and HR when practicing walking at home to assess tolerance, adjust distance, and monitor progress.  PATIENT EDUCATION: Education details: Continue HEP - work on shoulder stretches more often to see if this manages symptoms.  Ways to manage dizziness in  standing - priming muscle pump w/ light activity prior to standing, compression stockings/ab binder, marching in place in standing, hold onto support surface, slow transitions.  Rollator order request. Person educated: Patient and fiance - Josh Education method: Explanation, Demonstration, and Verbal cues Education comprehension: verbalized understanding and needs further education  HOME EXERCISE PROGRAM: Stand at counter 3-4x2 minute bouts daily.  Work on consistent morning and night routine to regulate sleep schedule and planned standing time throughout the day.  Correcting IR of R foot in standing for prolonged static stretch of hip and then when ambulating use a visual floor target to align foot better.  STS at counter w/ supervision - slow  transitions Seated - overhead arm press > forward press > lateral press  Access Code: [email or web address] URL: [email or web address] Date: 10/18/2023 Prepared by: Daved Bull  Exercises - Supine Bridge  - 1 x daily - 4-5 x weekly - 1-2 sets - 10 reps - Supine March  - 1 x daily - 4-5 x weekly - 1-2 sets - 10 reps - Clamshell with Resistance  - 1 x daily - 4-5 x weekly - 1-2 sets - 10-15 reps - Seated March With Resistance at Feet  - 1 x daily - 4-5 x weekly - 1-2 sets - 20 reps - Sitting Knee Extension with Resistance  - 1 x daily - 4-5 x weekly - 1-2 sets - 10 reps - Seated Heel Toe Raises  - 1 x daily - 4-5 x weekly - 2-3 sets - 10 reps - Supine Cervical Retraction with Towel  - 1 x daily - 4 x weekly - 1-2 sets - 10 reps - Supine Chin Tuck  - 1 x daily - 4 x weekly - 1-2 sets - 10 reps - Seated Upper Trapezius Stretch  - 1 x daily - 4 x weekly - 1 sets - 2-3 reps - 45 seconds hold  GOALS: Goals reviewed with patient? Yes  SHORT TERM GOALS: Target date: 10/04/2023  Pt will be independent and compliant with introductory strength and balance focused HEP in order to maintain functional progress and improve mobility. Baseline:  To be established. Goal status: INITIAL  2.  Pt will demonstrate TUG of </=32.12 seconds in order to decrease risk of falls and improve functional mobility using LRAD. Baseline: 42.12 sec CGA and HHA (7/9) Goal status: INITIAL  3.  Pt will ambulate>/=50 feet on to demonstrate improved endurance for functional tasks in home and community. Baseline: 10ft HHA and CGA w/ +2 w/c follow (7/9) Goal status: INITIAL  4.  Pt will demonstrate a gait speed of >/=1.03 feet/sec in order to decrease risk for falls. Baseline: 0.83 ft/sec HHA (left hand) and CGA w/ +2 w/c follow (7/9) Goal status: INITIAL  LONG TERM GOALS: Target date: 11/01/2023  Pt will be independent and compliant with advanced program to include: functional transfers, BLE strengthening,  core stability, static balance and endurance in order to maintain functional progress and improve mobility. Baseline: Established, but she has been too tired to do recently (8/15) Goal status: ONGOING  2.  Pt will demonstrate TUG of </=22.12 seconds in order to decrease risk of falls and improve functional mobility using LRAD. Baseline: 42.12 sec CGA and HHA (7/9); 25.81 sec CGA no AD (8/15) Goal status: PARTIALLY MET  3.  Pt will ambulate>/=75 feet on to demonstrate improved endurance for functional tasks in home and community. Baseline: 56ft HHA and CGA w/ +2 w/c follow (7/9); 230 ft CGA no AD (  wheelchair follow for safety) w/ single standing rest break (8/15) Goal status: MET  4.  Pt will demonstrate a gait speed of >/=1.23 feet/sec in order to decrease risk for falls. Baseline: 0.83 ft/sec HHA (left hand) and CGA w/ +2 w/c follow (7/9); 2.82 ft/sec no AD CGA (8/15) Goal status: MET  5.  Modified Oswestry score to improve to </=43.2% in order to demonstrate a clinically significant improvement in functional status and pain management. Baseline: 56%; 30/50 = 60% (8/15) Goal status: NOT MET  GOALS (at re-cert 1/84): Goals reviewed with patient? Yes  SHORT TERM LONG = LONG TERM GOALS: Target date: 11/29/2023  Pt will be independent and compliant with advanced program to include: functional transfers, BLE strengthening, core stability, static balance and endurance in order to maintain functional progress and improve mobility. Baseline: Established, but she has been too tired to do recently (8/15) Goal status: INITIAL  2.  Pt will demonstrate TUG of </=20.12 seconds in order to decrease risk of falls and improve functional mobility using LRAD. Baseline: 42.12 sec CGA and HHA (7/9); 25.81 sec CGA no AD (8/15) Goal status: REVISED  3.  Pt will ambulate>/=330 feet on to demonstrate improved endurance for functional tasks in home and community. Baseline: 62ft HHA and CGA w/ +2  w/c follow (7/9); 230 ft CGA no AD (wheelchair follow for safety) w/ single standing rest break (8/15) Goal status: REVISED  4.  Pt will demonstrate a gait speed of >/=3.02 feet/sec in order to decrease risk for falls. Baseline: 0.83 ft/sec HHA (left hand) and CGA w/ +2 w/c follow (7/9); 2.82 ft/sec no AD CGA (8/15) Goal status: REVISED  5.  Modified Oswestry score to improve to </=43.2% in order to demonstrate a clinically significant improvement in functional status and pain management. Baseline: 56%; 30/50 = 60% (8/15) Goal status: INITIAL  ASSESSMENT:  CLINICAL IMPRESSION: Patient has made some progress in her walking tolerance, but has low level persistent dizziness with standing and walking which may require further medical management if pt does not acclimate with increased exercise.  She would likely benefit from a rollator to support her during ambulation and for decreased endurance at current.  PT to continue addressing activity tolerance and strengthening as medically able. Continue per POC.  OBJECTIVE IMPAIRMENTS: Abnormal gait, cardiopulmonary status limiting activity, decreased activity tolerance, decreased balance, decreased coordination, decreased knowledge of condition, decreased knowledge of use of DME, decreased strength, dizziness, improper body mechanics, postural dysfunction, and pain.   ACTIVITY LIMITATIONS: carrying, lifting, bending, standing, squatting, stairs, transfers, bed mobility, bathing, toileting, dressing, reach over head, hygiene/grooming, and locomotion level  PARTICIPATION LIMITATIONS: meal prep, cleaning, laundry, driving, shopping, community activity, and occupation  PERSONAL FACTORS: Behavior pattern, Fitness, Past/current experiences, Time since onset of injury/illness/exacerbation, and 3+ comorbidities: psychological factors, chronic migraines, and ongoing workup for cardiovascular factors are also affecting patient's functional outcome.   REHAB  POTENTIAL: Good  CLINICAL DECISION MAKING: Evolving/moderate complexity  EVALUATION COMPLEXITY: Moderate  PLAN:  PT FREQUENCY: 1x/week  PT DURATION: 8 weeks + 4 wks  PLANNED INTERVENTIONS: [id number]- PT Re-evaluation, 97750- Physical Performance Testing, 97110-Therapeutic exercises, 97530- Therapeutic activity, [id number]- Neuromuscular re-education, 97535- Self Care, [id number]- Manual therapy, [id number]- Gait training, [phone]- Electrical stimulation (manual), [phone] (1-2 muscles), [id number] (3+ muscles)- Dry Needling, Patient/Family education, Balance training, Stair training, Taping, Vestibular training, DME instructions, Wheelchair mobility training, Cryotherapy, and Moist heat  PLAN FOR NEXT SESSION: L BP.  SciFit vs arm bike for endurance.  Modify HEP for low level  core, calf and LE focused strengthening HEP > progress to balance/standing tolerance.  Continue rollator unlevel surfaces/tight spaces, stairs.  Did she get compression stockings?  ASSESS LTGs - determine re-cert vs D/C!  Aquatic therapy previously did not help.  Daved KATHEE Bull, PT, DPT 11/20/2023, 1:17 PM

## 2023-11-29 ENCOUNTER — Ambulatory Visit: Admitting: Physical Therapy

## 2023-12-16 ENCOUNTER — Ambulatory Visit
